# Patient Record
Sex: Male | Born: 1990 | Race: White | Hispanic: No | Marital: Single | Smoking: Never smoker
Health system: Southern US, Community
[De-identification: ages and names within clinical notes are randomized; demographics above are authoritative.]

## PROBLEM LIST (undated history)

## (undated) DIAGNOSIS — F329 Major depressive disorder, single episode, unspecified: Secondary | ICD-10-CM

## (undated) DIAGNOSIS — F32A Depression, unspecified: Secondary | ICD-10-CM

## (undated) HISTORY — PX: WISDOM TOOTH EXTRACTION: SHX21

## (undated) HISTORY — PX: TESTICLE SURGERY: SHX794

## (undated) HISTORY — PX: HIP SURGERY: SHX245

## (undated) HISTORY — PX: HERNIA REPAIR: SHX51

## (undated) HISTORY — DX: Major depressive disorder, single episode, unspecified: F32.9

## (undated) HISTORY — DX: Depression, unspecified: F32.A

---

## 2011-05-08 ENCOUNTER — Emergency Department: Payer: Self-pay | Admitting: Unknown Physician Specialty

## 2012-02-04 ENCOUNTER — Emergency Department (HOSPITAL_COMMUNITY): Payer: Self-pay

## 2012-02-04 ENCOUNTER — Emergency Department (HOSPITAL_COMMUNITY)
Admission: EM | Admit: 2012-02-04 | Discharge: 2012-02-04 | Disposition: A | Discharge status: Home / Self Care | Payer: Self-pay | Attending: Emergency Medicine | Admitting: Emergency Medicine

## 2012-02-04 ENCOUNTER — Encounter (HOSPITAL_COMMUNITY): Payer: Self-pay | Admitting: *Deleted

## 2012-02-04 DIAGNOSIS — R55 Syncope and collapse: Secondary | ICD-10-CM | POA: Insufficient documentation

## 2012-02-04 DIAGNOSIS — M79609 Pain in unspecified limb: Secondary | ICD-10-CM | POA: Insufficient documentation

## 2012-02-04 DIAGNOSIS — L03019 Cellulitis of unspecified finger: Secondary | ICD-10-CM | POA: Insufficient documentation

## 2012-02-04 DIAGNOSIS — L299 Pruritus, unspecified: Secondary | ICD-10-CM | POA: Insufficient documentation

## 2012-02-04 DIAGNOSIS — L02519 Cutaneous abscess of unspecified hand: Secondary | ICD-10-CM | POA: Insufficient documentation

## 2012-02-04 DIAGNOSIS — M7989 Other specified soft tissue disorders: Secondary | ICD-10-CM | POA: Insufficient documentation

## 2012-02-04 DIAGNOSIS — R0602 Shortness of breath: Secondary | ICD-10-CM | POA: Insufficient documentation

## 2012-02-04 DIAGNOSIS — F17200 Nicotine dependence, unspecified, uncomplicated: Secondary | ICD-10-CM | POA: Insufficient documentation

## 2012-02-04 LAB — BASIC METABOLIC PANEL
Calcium: 9.9 mg/dL (ref 8.4–10.5)
Chloride: 101 mEq/L (ref 96–112)
Creatinine, Ser: 0.85 mg/dL (ref 0.50–1.35)
GFR calc Af Amer: >90 mL/min (ref >90)
Sodium: 138 mEq/L (ref 135–145)

## 2012-02-04 LAB — DIFFERENTIAL
Basophils Absolute: 0 10*3/uL (ref 0.0–0.1)
Basophils Relative: 1 % (ref 0–1)
Monocytes Absolute: 1.1 10*3/uL — ABNORMAL HIGH (ref 0.1–1.0)
Neutro Abs: 4.8 10*3/uL (ref 1.7–7.7)

## 2012-02-04 LAB — CBC
HCT: 45.2 % (ref 39.0–52.0)
MCHC: 34.3 g/dL (ref 30.0–36.0)
Platelets: 201 10*3/uL (ref 150–400)
RDW: 11.8 % (ref 11.5–15.5)

## 2012-02-04 MED ORDER — OXYCODONE-ACETAMINOPHEN 5-325 MG PO TABS: 1.0000 | ORAL_TABLET | ORAL | Status: AC | PRN

## 2012-02-04 MED ORDER — PIPERACILLIN-TAZOBACTAM 3.375 G IVPB
3.3750 g | Freq: Once | INTRAVENOUS | Status: AC
  Administered 2012-02-04: 3.375 g via INTRAVENOUS
  Filled 2012-02-04: qty 50

## 2012-02-04 MED ORDER — SULFAMETHOXAZOLE-TRIMETHOPRIM 800-160 MG PO TABS: 1.0000 | ORAL_TABLET | Freq: Two times a day (BID) | ORAL | Status: AC

## 2012-02-04 MED ORDER — VANCOMYCIN HCL IN DEXTROSE 1-5 GM/200ML-% IV SOLN
1000.0000 mg | Freq: Once | INTRAVENOUS | Status: AC
  Administered 2012-02-04: 1000 mg via INTRAVENOUS
  Filled 2012-02-04: qty 200

## 2012-02-04 MED ORDER — TETANUS-DIPHTH-ACELL PERTUSSIS 5-2.5-18.5 LF-MCG/0.5 IM SUSP
0.5000 mL | Freq: Once | INTRAMUSCULAR | Status: AC
  Administered 2012-02-04: 0.5 mL via INTRAMUSCULAR
  Filled 2012-02-04: qty 0.5

## 2012-02-04 NOTE — ED Notes (Signed)
1. Wound to right hand middle finger for 1 week, finger swollen, and draining clear/pus, red streaking up arm. 2. Also wants his breathing checked, has "been having trouble since allergy season started", worse in the am when getting up

## 2012-02-04 NOTE — ED Notes (Signed)
Pt had near syncopal episode after iv start. Pt pale and diaphoretic/n. Pt placed in trendelenburg, damp cloth given.nad noted.

## 2012-02-04 NOTE — ED Provider Notes (Signed)
History     CSN: [id number]  Arrival date & time 02/04/12  1008   First MD Initiated Contact with Patient 02/04/12 1305      Chief Complaint  Patient presents with  . Wound Check  . Shortness of Breath     Patient is a 21 y.o. male presenting with shortness of breath and abscess. The history is provided by the patient and a relative.  Shortness of Breath  Associated symptoms include shortness of breath. Pertinent negatives include no fever.  Abscess  This is a new problem. The current episode started more than one week ago. The onset was gradual. The problem occurs continuously. The problem has been gradually worsening. Affected Location: right middle finger. The problem is moderate. The abscess is characterized by itchiness, redness, painfulness and draining. The abscess first occurred at work. Pertinent negatives include no fever and no vomiting. He has received no recent medical care.  Pt presents with wound to right middle finger.  He reports it started as poison oak, and then became more swollen, draining and now streaking up his arm No fever but does report chills No cp No vomiting No abd pain No trauma reported He is not diabetic  PMH - none  Past Surgical History  Procedure Date  . Hip surgery   . Hernia repair   . Testicle surgery   . Wisdom tooth extraction     No family history on file.  History  Substance Use Topics  . Smoking status: Current Some Day Smoker  . Smokeless tobacco: Not on file  . Alcohol Use: Yes     occ      Review of Systems  Constitutional: Negative for fever.  Respiratory: Positive for shortness of breath.   Gastrointestinal: Negative for vomiting.  All other systems reviewed and are negative.    Allergies  Review of patient's allergies indicates not on file.  Home Medications  No current outpatient prescriptions on file.  BP 122/62  Pulse 74  Temp 97.7 F (36.5 C)  Resp 18  Ht 6' (1.829 m)  Wt 145 lb 1 oz (65.8 kg)   BMI 19.67 kg/m2  SpO2 100%  Physical Exam CONSTITUTIONAL: Well developed/well nourished HEAD AND FACE: Normocephalic/atraumatic EYES: EOMI/PERRL ENMT: Mucous membranes moist NECK: supple no meningeal signs SPINE:entire spine nontender CV: S1/S2 noted, no murmurs/rubs/gallops noted LUNGS: Lungs are clear to auscultation bilaterally, no apparent distress ABDOMEN: soft, nontender, no rebound or guarding GU:no cva tenderness NEURO: Pt is awake/alert, moves all extremitiesx4 EXTREMITIES: pulses normal - right middle finger - he has dry wound on aspect of distal dorsal surface just proximal to PIP without fluctuance or drainage/bleeding.  No crepitance.  He has edema/erythema to the finger localized around the wound, but no tenderness on flexor/extensor surface but he does have tenderness on medial/lateral aspect of finger.  He has erythematous streaking that extends to hand and into forearm He is able to fully range the finger SKIN: warm, color normal PSYCH: no abnormalities of mood noted  ED Course  Procedures    Labs Reviewed  CBC  DIFFERENTIAL  BASIC METABOLIC PANEL   Pt had brief vasovagal episode while getting IV placed, but now back to baseline and this occurred while in bed.  He is awake/alert at this time, no distress Also, he report SOB since "spring" with pollen, no distress noted, no CP.  Lungs clear 3:42 PM Discussed case with dr Romeo Apple He requests I call [facility] at [facility] 4:16 PM D/w  dr Amanda Pea He agrees with vancomycin and zosyn After discussion of his exam, we agreed to d/c home, start bactrim and pain meds and he is to return to [place] [facility] tomorrow for recheck and to call Dr Amanda Pea to come and see patient. Pt agreeable and he is to be NPO in the morning   MDM  Nursing notes reviewed and considered in documentation xrays reviewed and considered All labs/vitals reviewed and considered        Date: 02/04/2012  Rate: 62  Rhythm: normal sinus  rhythm  QRS Axis: normal  Intervals: normal  ST/T Wave abnormalities: normal  Conduction Disutrbances:none    Joya Gaskins, MD 02/04/12 [phone]

## 2012-02-05 ENCOUNTER — Emergency Department (HOSPITAL_COMMUNITY)
Admission: EM | Admit: 2012-02-05 | Discharge: 2012-02-05 | Disposition: A | Discharge status: Home / Self Care | Payer: Self-pay | Attending: Emergency Medicine | Admitting: Emergency Medicine

## 2012-02-05 ENCOUNTER — Encounter (HOSPITAL_COMMUNITY): Payer: Self-pay | Admitting: Emergency Medicine

## 2012-02-05 DIAGNOSIS — F17200 Nicotine dependence, unspecified, uncomplicated: Secondary | ICD-10-CM | POA: Insufficient documentation

## 2012-02-05 DIAGNOSIS — L03019 Cellulitis of unspecified finger: Secondary | ICD-10-CM | POA: Insufficient documentation

## 2012-02-05 DIAGNOSIS — J45909 Unspecified asthma, uncomplicated: Secondary | ICD-10-CM | POA: Insufficient documentation

## 2012-02-05 DIAGNOSIS — L02519 Cutaneous abscess of unspecified hand: Secondary | ICD-10-CM | POA: Insufficient documentation

## 2012-02-05 DIAGNOSIS — L03011 Cellulitis of right finger: Secondary | ICD-10-CM

## 2012-02-05 MED ORDER — ONDANSETRON HCL 4 MG/2ML IJ SOLN
4.0000 mg | Freq: Once | INTRAMUSCULAR | Status: DC
  Filled 2012-02-05: qty 2

## 2012-02-05 MED ORDER — LORAZEPAM 1 MG PO TABS
0.5000 mg | ORAL_TABLET | Freq: Once | ORAL | Status: AC
  Administered 2012-02-05: 0.5 mg via ORAL
  Filled 2012-02-05: qty 1

## 2012-02-05 MED ORDER — VANCOMYCIN HCL IN DEXTROSE 1-5 GM/200ML-% IV SOLN
1000.0000 mg | Freq: Once | INTRAVENOUS | Status: AC
  Administered 2012-02-05: 1000 mg via INTRAVENOUS
  Filled 2012-02-05: qty 200

## 2012-02-05 MED ORDER — PIPERACILLIN-TAZOBACTAM 3.375 G IVPB
3.3750 g | Freq: Once | INTRAVENOUS | Status: AC
  Administered 2012-02-05: 3.375 g via INTRAVENOUS
  Filled 2012-02-05: qty 50

## 2012-02-05 MED ORDER — SODIUM CHLORIDE 0.9 % IV BOLUS (SEPSIS)
500.0000 mL | Freq: Once | INTRAVENOUS | Status: AC
  Administered 2012-02-05: 1000 mL via INTRAVENOUS

## 2012-02-05 NOTE — ED Notes (Addendum)
Pt here for swelling to right middle finger and "spots" on genitals x 1 week. Pt seen at [facility] for same yesterday. Pt wasn't able to fill prescriptions. Pt also c/o cough and congestion.

## 2012-02-05 NOTE — ED Provider Notes (Signed)
History     CSN: [id number]  Arrival date & time 02/05/12  0865   First MD Initiated Contact with Patient 02/05/12 [phone]      Chief Complaint  Patient presents with  . Wound Check    (Consider location/radiation/quality/duration/timing/severity/associated sxs/prior treatment) HPI  Patient presents to emergency department for recheck of right middle finger infection is seen at [facility] emergency department last night by Dr. Bebe Shaggy who spoke with Dr. Amanda Pea and patient was instructed to present to the [facility] emergency department today at 9 AM, n.p.o., for further evaluation and management by Dr. Amanda Pea. Patient states that about a week ago he had a rash on his right middle finger that he thought was consistent with poison oak or poison ivy stating that he was pulling a bunch of vines at his job. Patient states the rash was initially itchy and mildly irritated. However, over the course of the week his right middle finger became more red, swollen, and painful. Patient is also complaining of a few itchy bumps on his penis. Patient states that the spots are similar to the initial spots seen on his finger. However patient denies any redness, pain, swelling, or drainage from the lesions on his penis. Patient states that they mildly itchy. He denies any fevers, chills, numbness or tingling of right hand, drainage from wound. He denies any nausea or vomiting. He denies any penile drainage or testicular pain or swelling.   Patient received 2 doses of IV vancomycin and Zosyn in the ER last night. He was prescribed by mouth Bactrim for home. Patient states he did not take a dose of Bactrim yet because he was n.p.o. for this morning.  Past Medical History  Diagnosis Date  . Asthma     Past Surgical History  Procedure Date  . Hip surgery   . Hernia repair   . Testicle surgery   . Wisdom tooth extraction     History reviewed. No pertinent family history.  History  Substance Use Topics  .  Smoking status: Current Some Day Smoker  . Smokeless tobacco: Not on file  . Alcohol Use: Yes     occ      Review of Systems  All other systems reviewed and are negative.    Allergies  Review of patient's allergies indicates no known allergies.  Home Medications   Current Outpatient Rx  Name Route Sig Dispense Refill  . DIPHENHYDRAMINE HCL 25 MG PO TABS Oral Take 25 mg by mouth every 6 (six) hours as needed. For allergies    . MONTELUKAST SODIUM 10 MG PO TABS Oral Take 10 mg by mouth daily as needed. For allergies    . OXYCODONE-ACETAMINOPHEN 5-325 MG PO TABS Oral Take 1 tablet by mouth every 4 (four) hours as needed for pain. 15 tablet 0  . SULFAMETHOXAZOLE-TRIMETHOPRIM 800-160 MG PO TABS Oral Take 1 tablet by mouth every 12 (twelve) hours. 14 tablet 0    BP 113/59  Pulse 84  Temp 98 F (36.7 C)  Resp 18  SpO2 100%  Physical Exam  Nursing note and vitals reviewed. Constitutional: He is oriented to person, place, and time. He appears well-developed and well-nourished. No distress.  HENT:  Head: Normocephalic and atraumatic.  Eyes: Conjunctivae are normal.  Neck: Normal range of motion. Neck supple.  Cardiovascular: Normal rate, regular rhythm, normal heart sounds and intact distal pulses.  Exam reveals no gallop and no friction rub.   No murmur heard. Pulmonary/Chest: Effort normal and breath  sounds normal. No respiratory distress. He has no wheezes. He has no rales. He exhibits no tenderness.  Abdominal: Soft. Bowel sounds are normal. He exhibits no distension and no mass. There is no tenderness. There is no rebound and no guarding.  Musculoskeletal: Normal range of motion. He exhibits edema and tenderness.       Entire right middle finger is swollen. Full range of motion with little to no pain. Good cap refill. See skin exam. Faint radiating erythema on the dorsal aspect of finger into hand.  Neurological: He is alert and oriented to person, place, and time.  Skin:  Skin is warm and dry. No rash noted. He is not diaphoretic. There is erythema.       4 x 4 millimeter ulceration of lateral edge of right middle finger with surrounding erythema but no drainage or fluctuance. Entire right middle finger is swollen.  Psychiatric: He has a normal mood and affect.    ED Course  Procedures (including critical care time)  9:34 AM I spoke with Dr. Amanda Pea who will evaluation patient in ER.   Dr. Amanda Pea evaluated patient in the ER. He is requesting one dose of IV vancomycin and Zosyn in the ER and then discharged home to have him followup in his office on Monday, 2 days from now, for recheck. Patient is to continue the PO Bactrim as directed last night. Patient and his family are agreeable to plan.  Labs Reviewed - No data to display Dg Finger Middle Right  02/04/2012  *RADIOLOGY REPORT*  Clinical Data: Redness and swelling of the finger.  RIGHT MIDDLE FINGER 2+V  Comparison: None.  Findings: Three-view exam shows no fracture or subluxation.  No worrisome lytic or sclerotic osseous abnormality.  No evidence for radiopaque soft tissue foreign body.  IMPRESSION: No acute bony findings.  Original Report Authenticated By: ERIC A. MANSELL, M.D.     1. Cellulitis of finger, right       MDM  Further assessment and treatment plan made with myself, Dr. Rubin Payor, and Dr. Amanda Pea. Patient is agreeable to following with Dr. Amanda Pea on Monday. We spoke at length about changing or worsening symptoms that should prompt immediate return to emergency department. Patient voices his understanding and is agreeable to plan.        Jenness Corner, [place] 02/05/12 1201

## 2012-02-05 NOTE — Discharge Instructions (Signed)
Take your antibiotics in its complete course. Keep wound on finger clean with mild soap and water and dressed with clean bandage. Call Dr. Carlos Levering office first thing Monday morning to schedule close followup appointment on Monday. Explain that you were seen in the ER and that he wanted your appointment on Monday. Return to emergency department for any emergent changing or worsening symptoms. Ice and elevate for additional pain relief, alternating between Tylenol and ibuprofen for pain.

## 2012-02-05 NOTE — Consult Note (Signed)
Reason for Consult:Infection right middle finger Referring Physician: [name] is an 21 y.o. male.  HPI: Patient is a pleasant male with a right middle finger wound off of the ulnar aspect of the middle phalanx region. He was given vancomycin and Zosyn yesterday and started on by mouth antibiotics. He has not obtained the antibiotics. He is here today with his family. He states the hand feels better. There is concern in regards to ascending cellulitis yesterday and thus he was referred to [facility]. I was asked to see him and comment on his infection and quarterback his care  I discussed with his family and the ER staff his upper extremity predicament. The patient has improvement in his hand subjectively and object to bleed appears. The patient is able to move the finger. He states this may have started after yardwork and exposure to poison oak.  Present time he is alert and oriented he notes no locking popping catching numbness or tingling.  Past Medical History  Diagnosis Date  . Asthma     Past Surgical History  Procedure Date  . Hip surgery   . Hernia repair   . Testicle surgery   . Wisdom tooth extraction     History reviewed. No pertinent family history.  Social History:  reports that he has been smoking.  He does not have any smokeless tobacco history on file. He reports that he drinks alcohol. He reports that he does not use illicit drugs.  Allergies: No Known Allergies  Medications: I have reviewed the patient's current medications.  Results for orders placed during the hospital encounter of 02/04/12 (from the past 48 hour(s))  CBC     Status: Normal   Collection Time   02/04/12  1:29 PM      Component Value Range Comment   WBC 8.5  4.0 - 10.5 (K/uL)    RBC 5.21  4.22 - 5.81 (MIL/uL)    Hemoglobin 15.5  13.0 - 17.0 (g/dL)    HCT 09.8  11.9 - 14.7 (%)    MCV 86.8  78.0 - 100.0 (fL)    MCH 29.8  26.0 - 34.0 (pg)    MCHC 34.3  30.0 - 36.0 (g/dL)    RDW  82.9  56.2 - 13.0 (%)    Platelets 201  150 - 400 (K/uL)   DIFFERENTIAL     Status: Abnormal   Collection Time   02/04/12  1:29 PM      Component Value Range Comment   Neutrophils Relative 57  43 - 77 (%)    Neutro Abs 4.8  1.7 - 7.7 (K/uL)    Lymphocytes Relative 27  12 - 46 (%)    Lymphs Abs 2.3  0.7 - 4.0 (K/uL)    Monocytes Relative 13 (*) 3 - 12 (%)    Monocytes Absolute 1.1 (*) 0.1 - 1.0 (K/uL)    Eosinophils Relative 3  0 - 5 (%)    Eosinophils Absolute 0.3  0.0 - 0.7 (K/uL)    Basophils Relative 1  0 - 1 (%)    Basophils Absolute 0.0  0.0 - 0.1 (K/uL)   BASIC METABOLIC PANEL     Status: Normal   Collection Time   02/04/12  1:29 PM      Component Value Range Comment   Sodium 138  135 - 145 (mEq/L)    Potassium 3.5  3.5 - 5.1 (mEq/L)    Chloride 101  96 - 112 (mEq/L)  CO2 26  19 - 32 (mEq/L)    Glucose, Bld 84  70 - 99 (mg/dL)    BUN 12  6 - 23 (mg/dL)    Creatinine, Ser 4.69  0.50 - 1.35 (mg/dL)    Calcium 9.9  8.4 - 10.5 (mg/dL)    GFR calc non Af Amer >90  >90 (mL/min)    GFR calc Af Amer >90  >90 (mL/min)     Dg Finger Middle Right  02/04/2012  *RADIOLOGY REPORT*  Clinical Data: Redness and swelling of the finger.  RIGHT MIDDLE FINGER 2+V  Comparison: None.  Findings: Three-view exam shows no fracture or subluxation.  No worrisome lytic or sclerotic osseous abnormality.  No evidence for radiopaque soft tissue foreign body.  IMPRESSION: No acute bony findings.  Original Report Authenticated By: ERIC A. MANSELL, M.D.    Review of Systems  HENT: Negative.   Eyes: Negative.   Respiratory: Positive for cough.   Cardiovascular: Negative.   Gastrointestinal: Negative.   Neurological: Negative.   Endo/Heme/Allergies: Negative.   Psychiatric/Behavioral: Negative.    Blood pressure 113/59, pulse 84, temperature 98 F (36.7 C), resp. rate 18, SpO2 100.00%. Physical Exam Patient is a pleasant male he has improvement in his upper extremity regarding cellulitis. He has  full ability to extend and flex the finger there is no instability symptoms. He has normal refill to the tip of the finger. I have performed a brief irrigation and debridement to the finger. Following this I discussed with the patient the necessity of soaking this 3 times a day and proper management. I dressed the wound myself. He has no [place] signs. He has no evidence of instability. There is no obvious bony abnormality. Marland Kitchen.The patient is alert and oriented in no acute distress the patient complains of pain in the affected upper extremity. The patient is noted to have a normal HEENT exam. Lung fields show equal chest expansion and no shortness of breath.  He does have some complaints of cough. abdomen exam is nontender without distention. Lower extremity examination does not show any fracture dislocation or blood clot symptoms. Pelvis is stable neck and back are stable and nontender Assessment/Plan: Cellulitis hand after a middle finger skin disruption. I would recommend additional vancomycin and Zosyn today in the emergency room and following this followup care in my office. I discussed with them all issues. I have personally written for Bactrim DS to be taken twice a day for 14 days. I discussed with the family warm soaks topical Neosporin and provided dressing supplies for home use.  I look forward to seeing him back in the office in 48-72 hours and asked him to notify me should any problems occur.  All issues have been discussed and all questions encouraged and answered. Certainly he looks improved however we'll keep an eye on the residual cellulitis which is very minimal today. It was a pleasure to see them  Karen Chafe 02/05/2012, 12:22 PM

## 2012-02-05 NOTE — ED Notes (Signed)
Dr Gramig at bedside. 

## 2012-02-06 NOTE — ED Provider Notes (Signed)
Medical screening examination/treatment/procedure(s) were performed by non-physician practitioner and as supervising physician I was immediately available for consultation/collaboration.  Tennille Montelongo R. Zoe Nordin, MD 02/06/12 0658 

## 2012-04-17 ENCOUNTER — Emergency Department (HOSPITAL_COMMUNITY)
Admission: EM | Admit: 2012-04-17 | Discharge: 2012-04-17 | Disposition: A | Discharge status: Home / Self Care | Payer: Self-pay | Attending: Emergency Medicine | Admitting: Emergency Medicine

## 2012-04-17 ENCOUNTER — Encounter (HOSPITAL_COMMUNITY): Payer: Self-pay | Admitting: *Deleted

## 2012-04-17 DIAGNOSIS — J029 Acute pharyngitis, unspecified: Secondary | ICD-10-CM | POA: Insufficient documentation

## 2012-04-17 DIAGNOSIS — F17200 Nicotine dependence, unspecified, uncomplicated: Secondary | ICD-10-CM | POA: Insufficient documentation

## 2012-04-17 MED ORDER — PENICILLIN G BENZATHINE 1200000 UNIT/2ML IM SUSP
1.2000 10*6.[IU] | Freq: Once | INTRAMUSCULAR | Status: AC
  Administered 2012-04-17: 1.2 10*6.[IU] via INTRAMUSCULAR
  Filled 2012-04-17: qty 2

## 2012-04-17 MED ORDER — IBUPROFEN 800 MG PO TABS
800.0000 mg | ORAL_TABLET | Freq: Once | ORAL | Status: AC
  Administered 2012-04-17: 800 mg via ORAL
  Filled 2012-04-17: qty 1

## 2012-04-17 NOTE — ED Provider Notes (Signed)
Medical screening examination/treatment/procedure(s) were performed by non-physician practitioner and as supervising physician I was immediately available for consultation/collaboration.  Shelda Jakes, MD 04/17/12 [phone]

## 2012-04-17 NOTE — ED Notes (Addendum)
C/o sore throat that started yesterday and rash to right middle finger area for the past few days, pt states that he has been tx for ?infection on the same finger before.

## 2012-04-17 NOTE — ED Notes (Signed)
Sore throat since yesterday.

## 2012-04-17 NOTE — ED Provider Notes (Signed)
History     CSN: [id number]  Arrival date & time 04/17/12  1516   First MD Initiated Contact with Patient 04/17/12 1555      Chief Complaint  Patient presents with  . Sore Throat    (Consider location/radiation/quality/duration/timing/severity/associated sxs/prior treatment) HPI Comments: Had headache yest also.  Denies abdominal pain   Patient is a 21 y.o. male presenting with pharyngitis. The history is provided by the patient. No language interpreter was used.  Sore Throat This is a new problem. The problem occurs constantly. The problem has been unchanged. Associated symptoms include chills, a fever, headaches, myalgias and a sore throat. Nothing aggravates the symptoms. He has tried nothing for the symptoms. The treatment provided no relief.    Past Medical History  Diagnosis Date  . Asthma     Past Surgical History  Procedure Date  . Hip surgery   . Hernia repair   . Testicle surgery   . Wisdom tooth extraction     History reviewed. No pertinent family history.  History  Substance Use Topics  . Smoking status: Current Some Day Smoker  . Smokeless tobacco: Not on file  . Alcohol Use: Yes     occ      Review of Systems  Constitutional: Positive for fever and chills.  HENT: Positive for sore throat.   Musculoskeletal: Positive for myalgias.  Neurological: Positive for headaches.  All other systems reviewed and are negative.    Allergies  Review of patient's allergies indicates no known allergies.  Home Medications   Current Outpatient Rx  Name Route Sig Dispense Refill  . ALBUTEROL SULFATE HFA 108 (90 BASE) MCG/ACT IN AERS Inhalation Inhale 2 puffs into the lungs every 6 (six) hours as needed. For shortness of breath.    Marland Kitchen DIPHENHYDRAMINE HCL 25 MG PO TABS Oral Take 25 mg by mouth every 6 (six) hours as needed. For allergies    . MONTELUKAST SODIUM 10 MG PO TABS Oral Take 10 mg by mouth daily as needed. For allergies      BP 120/62  Pulse 76   Temp 98.5 F (36.9 C) (Oral)  Resp 20  Ht 6' (1.829 m)  Wt 145 lb (65.772 kg)  BMI 19.67 kg/m2  SpO2 100%  Physical Exam  Nursing note and vitals reviewed. Constitutional: He is oriented to person, place, and time. He appears well-developed and well-nourished.  HENT:  Head: Normocephalic and atraumatic. No trismus in the jaw.  Mouth/Throat: Uvula is midline and mucous membranes are normal. No uvula swelling. Oropharyngeal exudate and posterior oropharyngeal erythema present. No posterior oropharyngeal edema.  Eyes: EOM are normal.  Neck: Normal range of motion.  Cardiovascular: Normal rate, regular rhythm, normal heart sounds and intact distal pulses.   Pulmonary/Chest: Effort normal and breath sounds normal. No respiratory distress.  Abdominal: Soft. He exhibits no distension. There is no splenomegaly. There is no tenderness. There is no rigidity and no guarding.  Musculoskeletal: Normal range of motion.  Lymphadenopathy:    He has cervical adenopathy.       Right cervical: Superficial cervical and deep cervical adenopathy present.       Left cervical: Superficial cervical and deep cervical adenopathy present.  Neurological: He is alert and oriented to person, place, and time.  Skin: Skin is warm and dry.  Psychiatric: He has a normal mood and affect. Judgment normal.    ED Course  Procedures (including critical care time)  Labs Reviewed - No data to display No results  found.   1. Pharyngitis       MDM  Doubt mono at present.  Pt prefers the IM injection of bicillin LA.  Ibuprofen, salt water gargles and chloraseptic.  F/u with PCP.        Evalina Field, [place] 04/17/12 1625

## 2012-07-08 ENCOUNTER — Emergency Department (HOSPITAL_COMMUNITY)
Admission: EM | Admit: 2012-07-08 | Discharge: 2012-07-08 | Disposition: A | Discharge status: Home / Self Care | Payer: Self-pay | Attending: Emergency Medicine | Admitting: Emergency Medicine

## 2012-07-08 ENCOUNTER — Encounter (HOSPITAL_COMMUNITY): Payer: Self-pay | Admitting: *Deleted

## 2012-07-08 ENCOUNTER — Emergency Department (HOSPITAL_COMMUNITY): Payer: Self-pay

## 2012-07-08 DIAGNOSIS — W268XXA Contact with other sharp object(s), not elsewhere classified, initial encounter: Secondary | ICD-10-CM | POA: Insufficient documentation

## 2012-07-08 DIAGNOSIS — S61209A Unspecified open wound of unspecified finger without damage to nail, initial encounter: Secondary | ICD-10-CM | POA: Insufficient documentation

## 2012-07-08 DIAGNOSIS — S61217A Laceration without foreign body of left little finger without damage to nail, initial encounter: Secondary | ICD-10-CM

## 2012-07-08 DIAGNOSIS — J45909 Unspecified asthma, uncomplicated: Secondary | ICD-10-CM | POA: Insufficient documentation

## 2012-07-08 DIAGNOSIS — F17200 Nicotine dependence, unspecified, uncomplicated: Secondary | ICD-10-CM | POA: Insufficient documentation

## 2012-07-08 MED ORDER — CEPHALEXIN 500 MG PO CAPS
500.0000 mg | ORAL_CAPSULE | Freq: Once | ORAL | Status: AC
  Administered 2012-07-08: 500 mg via ORAL
  Filled 2012-07-08: qty 1

## 2012-07-08 MED ORDER — CEPHALEXIN 500 MG PO CAPS: 500.0000 mg | ORAL_CAPSULE | Freq: Four times a day (QID) | ORAL | Status: DC

## 2012-07-08 NOTE — ED Notes (Signed)
Lac to left hand x 1 week ago. Tetanus UTD. Pt applied super glue to cut and noticed oozing this morning.

## 2012-07-08 NOTE — ED Provider Notes (Signed)
History     CSN: [id number]  Arrival date & time 07/08/12  1236   First MD Initiated Contact with Patient 07/08/12 1340      Chief Complaint  Patient presents with  . Extremity Laceration    (Consider location/radiation/quality/duration/timing/severity/associated sxs/prior treatment) HPI Comments: Patient complains of a nonhealing laceration to the left hand for one week. He states that his hand was caught on a metal edge. He tried to close the wound with bandages, but states the wound" opening up". States the wound has a persistent watery drainage that appears to be mixed with blood. Patient states he applied an over-the-counter" Super Glue" over the wound to stop the drainage. He also states that he has been unable to fully extend the distal end of the left finger since the incident occurred. He denies pain, swelling, fever, red streaks, or numbness of the left fingers.  Patient is a 21 y.o. male presenting with skin laceration. The history is provided by the patient and a parent.  Laceration  The incident occurred more than 2 days ago. Pain location: Left proximal fifth finger. The laceration is 3 cm in size. The laceration mechanism was a a metal edge. The patient is experiencing no pain. It is unknown if a foreign body is present. His tetanus status is UTD.    Past Medical History  Diagnosis Date  . Asthma     Past Surgical History  Procedure Date  . Hip surgery   . Hernia repair   . Testicle surgery   . Wisdom tooth extraction     No family history on file.  History  Substance Use Topics  . Smoking status: Current Some Day Smoker  . Smokeless tobacco: Not on file  . Alcohol Use: Yes     occ      Review of Systems  Constitutional: Negative for fever and chills.  Musculoskeletal: Negative for back pain, joint swelling and arthralgias.  Skin: Positive for wound.       Laceration   Neurological: Negative for dizziness, weakness, numbness and headaches.    Hematological: Does not bruise/bleed easily.  All other systems reviewed and are negative.    Allergies  Review of patient's allergies indicates no known allergies.  Home Medications   Current Outpatient Rx  Name Route Sig Dispense Refill  . ACETAMINOPHEN 500 MG PO TABS Oral Take 1,000 mg by mouth every 6 (six) hours as needed. Pain    . ALBUTEROL SULFATE HFA 108 (90 BASE) MCG/ACT IN AERS Inhalation Inhale 2 puffs into the lungs every 6 (six) hours as needed. For shortness of breath.      BP 135/84  Temp 98.2 F (36.8 C) (Oral)  Resp 16  SpO2 100%  Physical Exam  Nursing note and vitals reviewed. Constitutional: He is oriented to person, place, and time. He appears well-developed and well-nourished. No distress.  HENT:  Head: Normocephalic and atraumatic.  Cardiovascular: Normal rate, regular rhythm and normal heart sounds.   Pulmonary/Chest: Effort normal and breath sounds normal.  Musculoskeletal: He exhibits no edema and no tenderness.       Left hand: He exhibits decreased range of motion and laceration. He exhibits no tenderness, no bony tenderness, normal two-point discrimination, normal capillary refill, no deformity and no swelling. normal sensation noted. Normal strength noted.       Hands:      3 cm laceration to the dorsal surface of the proximal left fifth finger.  Wound edges are not approximated but appear  occluded with clear adhesive. Slight serosanguineous drainage at one end of the wound. Mild surrounding erythema, no edema or lymphangitis. Decreased extension of the left fifth finger at the level of the PIP joint. Radial pulse is brisk, distal sensation intact, cap refill is less than 2 seconds.  Neurological: He is alert and oriented to person, place, and time. He exhibits normal muscle tone. Coordination normal.  Skin: Skin is warm. Laceration noted.       See MS exam    ED Course  Procedures (including critical care time)  Labs Reviewed - No data to  display Dg Hand Complete Left  07/08/2012  *RADIOLOGY REPORT*  Clinical Data: Laceration at fifth MCP joint with pain.  LEFT HAND - COMPLETE 3+ VIEW  Comparison: None.  Findings: Probable soft tissue injury about the ulnar side of the fifth metacarpal phalangeal joint.  No radio-opaque foreign body. No acute fracture or dislocation.  IMPRESSION: Soft tissue injury adjacent the fifth MCP joint.  No acute osseous finding or radiopaque foreign object.   Original Report Authenticated By: Consuello Bossier, M.D.      Left finger was splinted and wound dressing applied, remains neurovascularly intact   MDM    Week old laceration to the left hand over the metacarpal head.  Wound is draining serous fluid, cvered with super glue by the patient. No edema , pus or red streaks at this time.  I have advised him of importance of close f/u and risk of infection.  He is aware and understands that it is a high possibilty of a tendon injury to the fifth finger, but states he does not want to see orthopedics for repair.  He also understands that the long-term effects of the injury could also result in decreased range of motion in the finger.  He agrees to return here for recheck if symptoms of infection are developing.  Pt agrees to care plan and verbalized understanding   The patient appears reasonably screened and/or stabilized for discharge and I doubt any other medical condition or other [facility] requiring further screening, evaluation, or treatment in the ED at this time prior to discharge.   Prescribed: Keflex     Asahd Can L. [place], [place] 07/09/12 1732

## 2012-07-17 NOTE — ED Provider Notes (Signed)
Medical screening examination/treatment/procedure(s) were performed by non-physician practitioner and as supervising physician I was immediately available for consultation/collaboration.  Arnol Mcgibbon T Trai Ells, MD 07/17/12 2131 

## 2013-08-08 ENCOUNTER — Ambulatory Visit (INDEPENDENT_AMBULATORY_CARE_PROVIDER_SITE_OTHER): Payer: PRIVATE HEALTH INSURANCE | Admitting: Family Medicine

## 2013-08-08 ENCOUNTER — Encounter: Payer: Self-pay | Admitting: Family Medicine

## 2013-08-08 ENCOUNTER — Encounter (INDEPENDENT_AMBULATORY_CARE_PROVIDER_SITE_OTHER): Payer: Self-pay

## 2013-08-08 VITALS — BP 124/76 | HR 71 | Temp 98.1°F | Ht 71.0 in | Wt 153.8 lb

## 2013-08-08 DIAGNOSIS — Z0000 Encounter for general adult medical examination without abnormal findings: Secondary | ICD-10-CM

## 2013-08-08 LAB — POCT CBC
Granulocyte percent: 55.6 %G (ref 37–80)
HCT, POC: 51.3 % (ref 43.5–53.7)
Hemoglobin: 16.6 g/dL (ref 14.1–18.1)
Lymph, poc: 2.7 (ref 0.6–3.4)
MCH, POC: 28.6 pg (ref 27–31.2)
MCHC: 32.3 g/dL (ref 31.8–35.4)
MCV: 88.6 fL (ref 80–97)
MPV: 7.4 fL (ref 0–99.8)
POC Granulocyte: 3.9 (ref 2–6.9)
POC LYMPH PERCENT: 38.6 %L (ref 10–50)
Platelet Count, POC: 225 10*3/uL (ref 142–424)
RBC: 5.8 M/uL (ref 4.69–6.13)
RDW, POC: 12.2 %
WBC: 7.1 10*3/uL (ref 4.6–10.2)

## 2013-08-08 MED ORDER — LORAZEPAM 0.5 MG PO TABS: 0.5000 mg | ORAL_TABLET | Freq: Two times a day (BID) | ORAL | Status: DC | PRN

## 2013-08-08 MED ORDER — PAROXETINE HCL 10 MG PO TABS: 10.0000 mg | ORAL_TABLET | Freq: Every day | ORAL | Status: DC

## 2013-08-08 NOTE — Patient Instructions (Signed)

## 2013-08-08 NOTE — Progress Notes (Signed)
  Subjective:    Patient ID: Juan Woods, male    DOB: Jun 01, 1991, 22 y.o.   MRN: [id number]  HPI This 22 y.o. male presents for evaluation of wellness exam. He has hx of anxiety and is taking paxil.  He states the paxil is Working well but he has had some breakthrough anxiety on occasion.   Review of Systems C/o anxiety No chest pain, SOB, HA, dizziness, vision change, N/V, diarrhea, constipation, dysuria, urinary urgency or frequency, myalgias, arthralgias or rash.     Objective:   Physical Exam Vital signs noted  Well developed well nourished male.  HEENT - Head atraumatic Normocephalic                Eyes - PERRLA, Conjuctiva - clear Sclera- Clear EOMI                Ears - EAC's Wnl TM's Wnl Gross Hearing WNL                Nose - Nares patent                 Throat - oropharanx wnl Respiratory - Lungs CTA bilateral Cardiac - RRR S1 and S2 without murmur GI - Abdomen soft Nontender and bowel sounds active x 4 Extremities - No edema. Neuro - Grossly intact.       Assessment & Plan:  Routine general medical examination at a health care facility - Plan: POCT CBC, CMP14+EGFR, Thyroid Panel With TSH, Lipid panel  Anxiety - Continue Paxil at curent dose.  Add Lorazepam 0.5mg  po bid prn severe anxiety #30 no refill  Deatra Canter FNP

## 2013-08-09 LAB — CMP14+EGFR
ALT: 14 IU/L (ref 0–44)
AST: 18 IU/L (ref 0–40)
Albumin/Globulin Ratio: 2.7 — ABNORMAL HIGH (ref 1.1–2.5)
Albumin: 4.9 g/dL (ref 3.5–5.5)
Alkaline Phosphatase: 55 IU/L (ref 39–117)
BUN/Creatinine Ratio: 15 (ref 8–19)
BUN: 16 mg/dL (ref 6–20)
CO2: 29 mmol/L (ref 18–29)
Calcium: 10.1 mg/dL (ref 8.7–10.2)
Chloride: 98 mmol/L (ref 97–108)
Creatinine, Ser: 1.07 mg/dL (ref 0.76–1.27)
GFR calc Af Amer: 115 mL/min/{1.73_m2} (ref >59)
GFR calc non Af Amer: 99 mL/min/{1.73_m2} (ref >59)
Globulin, Total: 1.8 g/dL (ref 1.5–4.5)
Glucose: 84 mg/dL (ref 65–99)
Potassium: 5 mmol/L (ref 3.5–5.2)
Sodium: 142 mmol/L (ref 134–144)
Total Bilirubin: 0.5 mg/dL (ref 0.0–1.2)
Total Protein: 6.7 g/dL (ref 6.0–8.5)

## 2013-08-09 LAB — LIPID PANEL
Chol/HDL Ratio: 3 ratio units (ref 0.0–5.0)
Cholesterol, Total: 158 mg/dL (ref 100–189)
HDL: 53 mg/dL (ref >39)
LDL Calculated: 92 mg/dL (ref 0–119)
Triglycerides: 66 mg/dL (ref 0–114)
VLDL Cholesterol Cal: 13 mg/dL (ref 5–40)

## 2013-08-09 LAB — THYROID PANEL WITH TSH
Free Thyroxine Index: 1.4 (ref 1.2–4.9)
T3 Uptake Ratio: 30 % (ref 24–39)
T4, Total: 4.6 ug/dL (ref 4.5–12.0)
TSH: 1.48 u[IU]/mL (ref 0.450–4.500)

## 2014-09-08 ENCOUNTER — Other Ambulatory Visit: Payer: Self-pay | Admitting: Family Medicine

## 2014-11-12 ENCOUNTER — Other Ambulatory Visit: Payer: Self-pay | Admitting: Family Medicine

## 2014-11-13 ENCOUNTER — Other Ambulatory Visit: Payer: Self-pay | Admitting: Family Medicine

## 2014-11-14 ENCOUNTER — Other Ambulatory Visit: Payer: Self-pay | Admitting: Family Medicine

## 2017-02-19 ENCOUNTER — Emergency Department (HOSPITAL_COMMUNITY)
Admission: EM | Admit: 2017-02-19 | Discharge: 2017-02-21 | Disposition: A | Discharge status: Other Acute Inpatient Hospital | Payer: Managed Care, Other (non HMO) | Attending: Emergency Medicine | Admitting: Emergency Medicine

## 2017-02-19 DIAGNOSIS — F10920 Alcohol use, unspecified with intoxication, uncomplicated: Secondary | ICD-10-CM

## 2017-02-19 DIAGNOSIS — F332 Major depressive disorder, recurrent severe without psychotic features: Secondary | ICD-10-CM | POA: Diagnosis not present

## 2017-02-19 DIAGNOSIS — R45851 Suicidal ideations: Secondary | ICD-10-CM

## 2017-02-19 DIAGNOSIS — F10120 Alcohol abuse with intoxication, uncomplicated: Secondary | ICD-10-CM | POA: Insufficient documentation

## 2017-02-19 DIAGNOSIS — J45909 Unspecified asthma, uncomplicated: Secondary | ICD-10-CM | POA: Insufficient documentation

## 2017-02-19 DIAGNOSIS — Z87891 Personal history of nicotine dependence: Secondary | ICD-10-CM | POA: Diagnosis not present

## 2017-02-19 DIAGNOSIS — R45850 Homicidal ideations: Secondary | ICD-10-CM

## 2017-02-19 DIAGNOSIS — F1410 Cocaine abuse, uncomplicated: Secondary | ICD-10-CM | POA: Diagnosis not present

## 2017-02-19 DIAGNOSIS — Z79899 Other long term (current) drug therapy: Secondary | ICD-10-CM | POA: Insufficient documentation

## 2017-02-19 LAB — RAPID URINE DRUG SCREEN, HOSP PERFORMED
Amphetamines: NOT DETECTED
Barbiturates: NOT DETECTED
Benzodiazepines: NOT DETECTED
COCAINE: POSITIVE — AB
OPIATES: NOT DETECTED
Tetrahydrocannabinol: NOT DETECTED

## 2017-02-19 LAB — COMPREHENSIVE METABOLIC PANEL
ALBUMIN: 4.7 g/dL (ref 3.5–5.0)
ALK PHOS: 49 U/L (ref 38–126)
ALT: 16 U/L — ABNORMAL LOW (ref 17–63)
ANION GAP: 11 (ref 5–15)
AST: 19 U/L (ref 15–41)
BILIRUBIN TOTAL: 0.5 mg/dL (ref 0.3–1.2)
BUN: 10 mg/dL (ref 6–20)
CALCIUM: 9.1 mg/dL (ref 8.9–10.3)
CO2: 24 mmol/L (ref 22–32)
CREATININE: 0.96 mg/dL (ref 0.61–1.24)
Chloride: 107 mmol/L (ref 101–111)
GFR calc Af Amer: >60 mL/min (ref >60)
GFR calc non Af Amer: >60 mL/min (ref >60)
GLUCOSE: 99 mg/dL (ref 65–99)
Potassium: 3.7 mmol/L (ref 3.5–5.1)
Sodium: 142 mmol/L (ref 135–145)
TOTAL PROTEIN: 7.1 g/dL (ref 6.5–8.1)

## 2017-02-19 LAB — CBC
HCT: 45.5 % (ref 39.0–52.0)
Hemoglobin: 16 g/dL (ref 13.0–17.0)
MCH: 30.5 pg (ref 26.0–34.0)
MCHC: 35.2 g/dL (ref 30.0–36.0)
MCV: 86.7 fL (ref 78.0–100.0)
PLATELETS: 221 10*3/uL (ref 150–400)
RBC: 5.25 MIL/uL (ref 4.22–5.81)
RDW: 12.3 % (ref 11.5–15.5)
WBC: 8.7 10*3/uL (ref 4.0–10.5)

## 2017-02-19 MED ORDER — IBUPROFEN 400 MG PO TABS: 600.0000 mg | ORAL_TABLET | Freq: Three times a day (TID) | ORAL | Status: DC | PRN

## 2017-02-19 MED ORDER — ALBUTEROL SULFATE HFA 108 (90 BASE) MCG/ACT IN AERS: 2.0000 | INHALATION_SPRAY | Freq: Four times a day (QID) | RESPIRATORY_TRACT | Status: DC | PRN

## 2017-02-19 MED ORDER — ALUM & MAG HYDROXIDE-SIMETH 200-200-20 MG/5ML PO SUSP: 30.0000 mL | ORAL | Status: DC | PRN

## 2017-02-19 MED ORDER — ZOLPIDEM TARTRATE 5 MG PO TABS
5.0000 mg | ORAL_TABLET | Freq: Every evening | ORAL | Status: DC | PRN
  Administered 2017-02-20: 5 mg via ORAL
  Filled 2017-02-19: qty 1

## 2017-02-19 MED ORDER — PAROXETINE HCL 10 MG PO TABS
10.0000 mg | ORAL_TABLET | Freq: Every day | ORAL | Status: DC
  Administered 2017-02-20 – 2017-02-21 (×2): 10 mg via ORAL
  Filled 2017-02-19 (×5): qty 1

## 2017-02-19 MED ORDER — LORAZEPAM 1 MG PO TABS: 1.0000 mg | ORAL_TABLET | Freq: Three times a day (TID) | ORAL | Status: DC | PRN

## 2017-02-19 MED ORDER — ONDANSETRON HCL 4 MG PO TABS: 4.0000 mg | ORAL_TABLET | Freq: Three times a day (TID) | ORAL | Status: DC | PRN

## 2017-02-19 MED ORDER — ACETAMINOPHEN 325 MG PO TABS: 650.0000 mg | ORAL_TABLET | ORAL | Status: DC | PRN

## 2017-02-19 NOTE — ED Notes (Signed)
Pt wanded by security, belongings secured, cords in room put away out of patients reach, sitter at bedside
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
## 2017-02-20 LAB — ACETAMINOPHEN LEVEL

## 2017-02-20 LAB — SALICYLATE LEVEL

## 2017-02-20 LAB — ETHANOL: Alcohol, Ethyl (B): 212 mg/dL — ABNORMAL HIGH (ref <5)

## 2017-02-20 NOTE — ED Notes (Signed)
Pt cooperative but becoming more agitated. [facility]CSO continues to be at bedside

## 2017-02-20 NOTE — Progress Notes (Signed)
Patient was recommended inpatient treatment and [facility] the following [facility]: [facility], [facility], [facility], [facility], [facility].  CSW in disposition will continue to seek placement.  Melbourne Abtsatia Naarah Borgerding, LCSWA Disposition staff 02/20/2017 10:05 AM

## 2017-02-20 NOTE — ED Notes (Signed)
Pt updated on plan of care. Pt states he feels a lot of his Si problems occur after he has been drinking. States [place].

## 2017-02-20 NOTE — ED Notes (Signed)
[facility] called, [facility]. There is no bed or accepting doctor information. AC will call with that info when available.

## 2017-02-20 NOTE — ED Notes (Signed)
tts at bedside 

## 2017-02-20 NOTE — ED Notes (Signed)
Patient provided with meal, ate 100% of it.

## 2017-02-20 NOTE — ED Notes (Signed)
Pt given meal tray.
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
## 2017-02-20 NOTE — Progress Notes (Signed)
Patient has been [facility] will call with complete accepting information when there are discharges.  [facility] RN Leotis ShamesLauren was notified.  [place] Abtsatia Yusif Gnau, LCSWA Disposition staff 02/20/2017 10:23 AM

## 2017-02-20 NOTE — ED Notes (Signed)
Pt given breakfast tray

## 2017-02-20 NOTE — ED Notes (Signed)
Pt sleeping at this time. RCSO at bedside.

## 2017-02-20 NOTE — ED Notes (Addendum)
Pt returned from shower at this time, linens changed

## 2017-02-21 ENCOUNTER — Inpatient Hospital Stay (HOSPITAL_COMMUNITY)
Admission: AD | Admit: 2017-02-21 | Discharge: 2017-02-25 | DRG: 897 | Disposition: A | Discharge status: Home / Self Care | Payer: 59 | Attending: Psychiatry | Admitting: Psychiatry

## 2017-02-21 ENCOUNTER — Encounter (HOSPITAL_COMMUNITY): Payer: Self-pay

## 2017-02-21 DIAGNOSIS — Z880 Allergy status to penicillin: Secondary | ICD-10-CM | POA: Diagnosis not present

## 2017-02-21 DIAGNOSIS — G4700 Insomnia, unspecified: Secondary | ICD-10-CM | POA: Diagnosis present

## 2017-02-21 DIAGNOSIS — J45909 Unspecified asthma, uncomplicated: Secondary | ICD-10-CM | POA: Diagnosis present

## 2017-02-21 DIAGNOSIS — Z79899 Other long term (current) drug therapy: Secondary | ICD-10-CM

## 2017-02-21 DIAGNOSIS — F329 Major depressive disorder, single episode, unspecified: Secondary | ICD-10-CM | POA: Diagnosis present

## 2017-02-21 DIAGNOSIS — F1020 Alcohol dependence, uncomplicated: Principal | ICD-10-CM | POA: Diagnosis present

## 2017-02-21 DIAGNOSIS — Z87891 Personal history of nicotine dependence: Secondary | ICD-10-CM | POA: Diagnosis not present

## 2017-02-21 DIAGNOSIS — F332 Major depressive disorder, recurrent severe without psychotic features: Secondary | ICD-10-CM | POA: Diagnosis not present

## 2017-02-21 DIAGNOSIS — Y907 Blood alcohol level of 200-239 mg/100 ml: Secondary | ICD-10-CM | POA: Diagnosis present

## 2017-02-21 MED ORDER — ALBUTEROL SULFATE HFA 108 (90 BASE) MCG/ACT IN AERS: 1.0000 | INHALATION_SPRAY | Freq: Four times a day (QID) | RESPIRATORY_TRACT | Status: DC | PRN

## 2017-02-21 MED ORDER — ACETAMINOPHEN 325 MG PO TABS: 650.0000 mg | ORAL_TABLET | Freq: Four times a day (QID) | ORAL | Status: DC | PRN

## 2017-02-21 MED ORDER — TRAZODONE HCL 50 MG PO TABS
50.0000 mg | ORAL_TABLET | Freq: Every evening | ORAL | Status: DC | PRN
  Administered 2017-02-21 – 2017-02-24 (×4): 50 mg via ORAL
  Filled 2017-02-21 (×4): qty 1

## 2017-02-21 MED ORDER — PAROXETINE HCL 10 MG PO TABS
10.0000 mg | ORAL_TABLET | Freq: Every day | ORAL | Status: DC
  Administered 2017-02-22: 10 mg via ORAL
  Filled 2017-02-21 (×3): qty 1

## 2017-02-21 MED ORDER — ALUM & MAG HYDROXIDE-SIMETH 200-200-20 MG/5ML PO SUSP: 30.0000 mL | ORAL | Status: DC | PRN

## 2017-02-21 MED ORDER — MAGNESIUM HYDROXIDE 400 MG/5ML PO SUSP: 30.0000 mL | Freq: Every day | ORAL | Status: DC | PRN

## 2017-02-21 MED ORDER — LORAZEPAM 0.5 MG PO TABS: 0.5000 mg | ORAL_TABLET | Freq: Four times a day (QID) | ORAL | Status: DC | PRN

## 2017-02-21 NOTE — ED Notes (Signed)
Pt leaving at this time with [facility]: [facility] given: EMTALA form, med necessity form, facesheet, carelink/transfer, e-signature, IVC paperwork for facility and IVC paperwork for [facility].  PT alert and oriented at discharge, ambulatory, calm, cooperative.

## 2017-02-21 NOTE — Tx Team (Signed)
Initial Treatment Plan 02/21/2017 5:18 PM Juan EmmerJames R [place] [id number]    PATIENT STRESSORS: Financial difficulties Substance abuse   PATIENT STRENGTHS: Wellsite geologistCommunication skills General fund of knowledge Physical Health Supportive family/friends Work skills   PATIENT IDENTIFIED PROBLEMS: Depression  Substance abuse  Suicidal ideation  "Good mindset"  "Get back to my 9 months clean again"             DISCHARGE CRITERIA:  Improved stabilization in mood, thinking, and/or behavior Verbal commitment to aftercare and medication compliance Withdrawal symptoms are absent or subacute and managed without 24-hour nursing intervention  PRELIMINARY DISCHARGE PLAN: Outpatient therapy Medication management  PATIENT/FAMILY INVOLVEMENT: This treatment plan has been presented to and reviewed with the patient, Juan EmmerJames R Conran.  The patient and family have been given the opportunity to ask questions and make suggestions.  Levin BaconHeather V Wandalee Klang, RN 02/21/2017, 5:18 PM
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
## 2017-02-21 NOTE — ED Notes (Signed)
Dispo was set to discharge in error, corrected to "transfer" via Dr. Ranae PalmsYelverton.

## 2017-02-21 NOTE — Plan of Care (Signed)
Problem: Activity: Goal: Sleeping patterns will improve Outcome: Progressing Pt. asked for a PRN Trazodone at bedtime.

## 2017-02-21 NOTE — ED Notes (Signed)
Pt ambulated to bathroom 

## 2017-02-21 NOTE — ED Notes (Signed)
Meal tray at bedside.  

## 2017-02-21 NOTE — BH Assessment (Signed)
Pt [facility] Attending will be Dr. Jama Flavorsobos and Accepting is De BurrsLaurie Park NP. Report # [id number]. RN notified. Pt can be transported as soon as possible.   [address]Ikeya Brockel TrooperLPC, LCAS

## 2017-02-21 NOTE — Progress Notes (Signed)
Adult Psychoeducational Group Note  Date:  02/21/2017 Time:  8:57 PM  Group Topic/Focus:  Wrap-Up Group:   The focus of this group is to help patients review their daily goal of treatment and discuss progress on daily workbooks.  Participation Level:  Active  Participation Quality:  Appropriate and Attentive  Affect:  Appropriate  Cognitive:  Appropriate  Insight: Appropriate  Engagement in Group:  Engaged  Modes of Intervention:  Discussion  Additional Comments:  Pt stated his goal for today was to learn new coping skills. Pt identified 2 coping skills: 1) working out, and 2) compromising, especially in regards to relationships.  Juan CorwinOwen, Steffi Noviello C 02/21/2017, 8:57 PM

## 2017-02-22 DIAGNOSIS — F1020 Alcohol dependence, uncomplicated: Principal | ICD-10-CM

## 2017-02-22 MED ORDER — ONDANSETRON 4 MG PO TBDP: 4.0000 mg | ORAL_TABLET | Freq: Four times a day (QID) | ORAL | Status: DC | PRN

## 2017-02-22 MED ORDER — PAROXETINE HCL 10 MG PO TABS
10.0000 mg | ORAL_TABLET | Freq: Every day | ORAL | Status: DC
  Administered 2017-02-23 – 2017-02-24 (×2): 10 mg via ORAL
  Filled 2017-02-22 (×4): qty 1

## 2017-02-22 MED ORDER — LOPERAMIDE HCL 2 MG PO CAPS: 2.0000 mg | ORAL_CAPSULE | ORAL | Status: DC | PRN

## 2017-02-22 MED ORDER — HYDROXYZINE HCL 25 MG PO TABS: 25.0000 mg | ORAL_TABLET | Freq: Four times a day (QID) | ORAL | Status: DC | PRN

## 2017-02-22 MED ORDER — THIAMINE HCL 100 MG/ML IJ SOLN
100.0000 mg | Freq: Once | INTRAMUSCULAR | Status: DC
  Filled 2017-02-22: qty 2

## 2017-02-22 MED ORDER — VITAMIN B-1 100 MG PO TABS
100.0000 mg | ORAL_TABLET | Freq: Every day | ORAL | Status: DC
  Administered 2017-02-23 – 2017-02-25 (×3): 100 mg via ORAL
  Filled 2017-02-22 (×5): qty 1

## 2017-02-22 MED ORDER — LORAZEPAM 1 MG PO TABS: 1.0000 mg | ORAL_TABLET | Freq: Four times a day (QID) | ORAL | Status: DC | PRN

## 2017-02-22 MED ORDER — ADULT MULTIVITAMIN W/MINERALS CH
1.0000 | ORAL_TABLET | Freq: Every day | ORAL | Status: DC
  Administered 2017-02-22 – 2017-02-25 (×4): 1 via ORAL
  Filled 2017-02-22 (×8): qty 1

## 2017-02-22 NOTE — Progress Notes (Signed)
DAR NOTE: Patient presents with anxious affect and mood.  Denies auditory and visual hallucinations.  Described energy level as low and concentration as poor.  Rates depression at 1, hopelessness at 1, and anxiety at 0.  Maintained on routine safety checks.  Medications given as prescribed.  Support and encouragement offered as needed.  Attended group and participated.  States goal for today is "relaxation, keep stress down."  Patient is withdrawn and keeps to himself.  Complain of lightheaded and dizziness.  Request to change medication time to bedtime.
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
## 2017-02-22 NOTE — Progress Notes (Signed)
  DATA ACTION RESPONSE  Objective- Pt. is visible in the dayroom, seen watching TV. No interaction. Presents with an anxious/depressed affect and mood. Pt. was appropriate and engaging in  rapport. No other c/o or abnormal s/s.  Subjective- Denies having any SI/HI/AVH/Pain at this time. Pt. states " Can I get something for sleep tonight?" Continues to be cooperative & pleasant.  Remains safe on the unit.  1:1 interaction in private to establish rapport. Encouragement, education, & support given from staff. . PRN Trazodone requested and will re-eval accordingly.   Safety maintained with Q 15 checks. Continues to follow treatment plan and will monitor closely. No additonal questions/concerns noted.

## 2017-02-22 NOTE — Tx Team (Signed)
Interdisciplinary Treatment and Diagnostic Plan Update  02/22/2017 Time of Session: 9:30am Juan Woods MRN: [id number]  Principal Diagnosis: Alcohol use disorder, moderate, dependence ([place])  Secondary Diagnoses: Principal Problem:   Alcohol use disorder, moderate, dependence ([place])   Current Medications:  Current Facility-Administered Medications  Medication Dose Route Frequency Provider Last Rate Last Dose  . acetaminophen (TYLENOL) tablet 650 mg  650 mg Oral Q6H PRN Niel Hummer, NP      . albuterol (PROVENTIL HFA;VENTOLIN HFA) 108 (90 Base) MCG/ACT inhaler 1-2 puff  1-2 puff Inhalation Q6H PRN Niel Hummer, NP      . alum & mag hydroxide-simeth (MAALOX/MYLANTA) 200-200-20 MG/5ML suspension 30 mL  30 mL Oral Q4H PRN Niel Hummer, NP      . hydrOXYzine (ATARAX/VISTARIL) tablet 25 mg  25 mg Oral Q6H PRN Cobos, Myer Peer, MD      . loperamide (IMODIUM) capsule 2-4 mg  2-4 mg Oral PRN Cobos, Myer Peer, MD      . LORazepam (ATIVAN) tablet 1 mg  1 mg Oral Q6H PRN Cobos, Myer Peer, MD      . magnesium hydroxide (MILK OF MAGNESIA) suspension 30 mL  30 mL Oral Daily PRN Niel Hummer, NP      . multivitamin with minerals tablet 1 tablet  1 tablet Oral Daily Cobos, Myer Peer, MD      . ondansetron (ZOFRAN-ODT) disintegrating tablet 4 mg  4 mg Oral Q6H PRN Cobos, Myer Peer, MD      . Derrill Memo ON 02/23/2017] PARoxetine (PAXIL) tablet 10 mg  10 mg Oral QHS Cobos, Fernando A, MD      . thiamine (B-1) injection 100 mg  100 mg Intramuscular Once Cobos, Myer Peer, MD      . Derrill Memo ON 02/23/2017] thiamine (VITAMIN B-1) tablet 100 mg  100 mg Oral Daily Cobos, Myer Peer, MD      . traZODone (DESYREL) tablet 50 mg  50 mg Oral QHS PRN Niel Hummer, NP   50 mg at 02/21/17 2104    PTA Medications: Prescriptions Prior to Admission  Medication Sig Dispense Refill Last Dose  . acyclovir (ZOVIRAX) 400 MG tablet Take 400 mg by mouth at bedtime.    Past Week at Unknown time  . albuterol (PROVENTIL  HFA;VENTOLIN HFA) 108 (90 BASE) MCG/ACT inhaler Inhale 1-2 puffs into the lungs every 6 (six) hours as needed for wheezing or shortness of breath.    Past Month at Unknown time  . PARoxetine (PAXIL) 20 MG tablet Take 20 mg by mouth at bedtime.   Past Week at Unknown time  . traZODone (DESYREL) 50 MG tablet Take 25 mg by mouth at bedtime.   Past Week at Unknown time    Treatment Modalities: Medication Management, Group therapy, Case management,  1 to 1 session with clinician, Psychoeducation, Recreational therapy.  Patient Stressors: Financial difficulties Substance abuse  Patient Strengths: Curator fund of knowledge Physical Health Supportive family/friends Work Artist for Primary Diagnosis: Alcohol use disorder, moderate, dependence ([place]) Long Term Goal(s): Improvement in symptoms so as ready for discharge  Short Term Goals: Ability to identify changes in lifestyle to reduce recurrence of condition will improve Ability to verbalize feelings will improve Ability to disclose and discuss suicidal ideas Ability to demonstrate self-control will improve Ability to identify and develop effective coping behaviors will improve Ability to maintain clinical measurements within normal limits will improve Compliance with prescribed medications will improve Ability to  identify triggers associated with substance abuse/mental health issues will improve Ability to identify changes in lifestyle to reduce recurrence of condition will improve Ability to verbalize feelings will improve Ability to disclose and discuss suicidal ideas Ability to demonstrate self-control will improve Ability to identify and develop effective coping behaviors will improve Ability to maintain clinical measurements within normal limits will improve Compliance with prescribed medications will improve Ability to identify triggers associated with substance abuse/mental health  issues will improve  Medication Management: Evaluate patient's response, side effects, and tolerance of medication regimen.  Therapeutic Interventions: 1 to 1 sessions, Unit Group sessions and Medication administration.  Evaluation of Outcomes: Not Met  Physician Treatment Plan for Secondary Diagnosis: Principal Problem:   Alcohol use disorder, moderate, dependence ([place])   Long Term Goal(s): Improvement in symptoms so as ready for discharge  Short Term Goals: Ability to identify changes in lifestyle to reduce recurrence of condition will improve Ability to verbalize feelings will improve Ability to disclose and discuss suicidal ideas Ability to demonstrate self-control will improve Ability to identify and develop effective coping behaviors will improve Ability to maintain clinical measurements within normal limits will improve Compliance with prescribed medications will improve Ability to identify triggers associated with substance abuse/mental health issues will improve Ability to identify changes in lifestyle to reduce recurrence of condition will improve Ability to verbalize feelings will improve Ability to disclose and discuss suicidal ideas Ability to demonstrate self-control will improve Ability to identify and develop effective coping behaviors will improve Ability to maintain clinical measurements within normal limits will improve Compliance with prescribed medications will improve Ability to identify triggers associated with substance abuse/mental health issues will improve  Medication Management: Evaluate patient's response, side effects, and tolerance of medication regimen.  Therapeutic Interventions: 1 to 1 sessions, Unit Group sessions and Medication administration.  Evaluation of Outcomes: Not Met   RN Treatment Plan for Primary Diagnosis: Alcohol use disorder, moderate, dependence ([place]) Long Term Goal(s): Knowledge of disease and therapeutic regimen to maintain  health will improve  Short Term Goals: Ability to verbalize feelings will improve, Ability to disclose and discuss suicidal ideas and Compliance with prescribed medications will improve  Medication Management: RN will administer medications as ordered by provider, will assess and evaluate patient's response and provide education to patient for prescribed medication. RN will report any adverse and/or side effects to prescribing provider.  Therapeutic Interventions: 1 on 1 counseling sessions, Psychoeducation, Medication administration, Evaluate responses to treatment, Monitor vital signs and CBGs as ordered, Perform/monitor CIWA, COWS, AIMS and Fall Risk screenings as ordered, Perform wound care treatments as ordered.  Evaluation of Outcomes: Not Met   LCSW Treatment Plan for Primary Diagnosis: Alcohol use disorder, moderate, dependence ([place]) Long Term Goal(s): Safe transition to appropriate next level of care at discharge, Engage patient in therapeutic group addressing interpersonal concerns.  Short Term Goals: Engage patient in aftercare planning with referrals and resources, Identify triggers associated with mental health/substance abuse issues and Increase skills for wellness and recovery  Therapeutic Interventions: Assess for all discharge needs, 1 to 1 time with Social worker, Explore available resources and support systems, Assess for adequacy in community support network, Educate family and significant other(s) on suicide prevention, Complete Psychosocial Assessment, Interpersonal group therapy.  Evaluation of Outcomes: Not Met   Progress in Treatment: Attending groups: Pt is new to milieu, continuing to assess  Participating in groups: Pt is new to milieu, continuing to assess  Taking medication as prescribed: Yes, MD continues to assess  for medication changes as needed Toleration medication: Yes, no side effects reported at this time Family/Significant other contact made: No, CSW  assessing for appropriate contact Patient understands diagnosis: Continuing to assess Discussing patient identified problems/goals with staff: Yes Medical problems stabilized or resolved: Yes Denies suicidal/homicidal ideation: Yes Issues/concerns per patient self-inventory: None Other: N/A  New problem(s) identified: None identified at this time.   New Short Term/Long Term Goal(s): None identified at this time.   Discharge Plan or Barriers: CSW will assess for appropriate discharge plan and relevant barriers.   Reason for Continuation of Hospitalization: Anxiety Depression Medication stabilization  Estimated Length of Stay: 3-5 days  Attendees: Patient: 02/22/2017  4:11 PM  Physician: Dr. Parke Poisson 02/22/2017  4:11 PM  Nursing: Minta Balsam, RN 02/22/2017  4:11 PM  RN Care Manager: Lars Pinks, RN 02/22/2017  4:11 PM  Social Worker: Adriana Reams, LCSW; Matthew Saras, [place] 02/22/2017  4:11 PM  Recreational Therapist:  02/22/2017  4:11 PM  Other: Lindell Spar, NP; Samuel Jester, NP 02/22/2017  4:11 PM  Other:  02/22/2017  4:11 PM  Other: 02/22/2017  4:11 PM    Scribe for Treatment Team: Gladstone Lighter, LCSW 02/22/2017 4:11 PM

## 2017-02-22 NOTE — H&P (Signed)
Psychiatric Admission Assessment Adult  Patient Identification: Juan Woods MRN:  [id number] Date of Evaluation:  02/22/2017 Chief Complaint:  MDD,rec,sev  etoh use disorder,moderate Cocaine use disorder Principal Diagnosis: Alcohol use disorder, moderate, dependence (HCC) Diagnosis:   Patient Active Problem List   Diagnosis Date Noted  . Alcohol use disorder, moderate, dependence (HCC) [F10.20] 02/21/2017    Priority: High   History of Present Illness: Juan Woods, 26 yo male patient, single, employed.  Patient BAL upon admission was 212.  Patient is pleasant.  He admits to drinking to feel good.  He has been prescribed Paxil for depression and he is compliant on the medications.  He states that it is working.  He was in a detox program and was sober for 9 months.  He states that he relapsed back in April due to work related stress.  He does not recall threatening to end his life nor does he recall threatening to hurt his parents.   He states that he does cocaine 75 % time when drinking.  "I can drink more when I do cocaine at the same time."  He states binge drinking twice weekly, 12-18 beers at a time.    Associated Signs/Symptoms: Depression Symptoms:  depressed mood, hopelessness, anxiety, (Hypo) Manic Symptoms:  Impulsivity, Anxiety Symptoms:  Excessive Worry, Psychotic Symptoms:  Paranoia, PTSD Symptoms: NA Total Time spent with patient: 45 minutes  Past Psychiatric History: see HPI  Is the patient at risk to self? Yes.    Has the patient been a risk to self in the past 6 months? Yes.    Has the patient been a risk to self within the distant past? Yes.    Is the patient a risk to others? No.  Has the patient been a risk to others in the past 6 months? No.  Has the patient been a risk to others within the distant past? No.   Prior Inpatient Therapy:   Prior Outpatient Therapy:    Alcohol Screening: 1. How often do you have a drink containing alcohol?: 2 to 3 times a  week 2. How many drinks containing alcohol do you have on a typical day when you are drinking?: 10 or more 3. How often do you have six or more drinks on one occasion?: Weekly Preliminary Score: 7 4. How often during the last year have you found that you were not able to stop drinking once you had started?: Weekly 5. How often during the last year have you failed to do what was normally expected from you becasue of drinking?: Less than monthly 6. How often during the last year have you needed a first drink in the morning to get yourself going after a heavy drinking session?: Never 7. How often during the last year have you had a feeling of guilt of remorse after drinking?: Less than monthly 8. How often during the last year have you been unable to remember what happened the night before because you had been drinking?: Monthly 9. Have you or someone else been injured as a result of your drinking?: No 10. Has a relative or friend or a doctor or another health worker been concerned about your drinking or suggested you cut down?: No Alcohol Use Disorder Identification Test Final Score (AUDIT): 17 Brief Intervention: Yes Substance Abuse History in the last 12 months:  Yes.   Consequences of Substance Abuse: NA Previous Psychotropic Medications: Yes  Psychological Evaluations: Yes  Past Medical History:  Past Medical History:  Diagnosis  Date  . Asthma     Past Surgical History:  Procedure Laterality Date  . HERNIA REPAIR    . HIP SURGERY    . TESTICLE SURGERY    . WISDOM TOOTH EXTRACTION     Family History:  Family History  Problem Relation Age of Onset  . Fibromyalgia Mother   . Hypertension Mother   . Hypertension Father    Family Psychiatric  History: see HPI Tobacco Screening: Have you used any form of tobacco in the last 30 days? (Cigarettes, Smokeless Tobacco, Cigars, and/or Pipes): Yes Tobacco use, Select all that apply: smokeless tobacco use, not daily Are you interested in  Tobacco Cessation Medications?: No, patient refused Counseled patient on smoking cessation including recognizing danger situations, developing coping skills and basic information about quitting provided: Refused/Declined practical counseling Social History:  History  Alcohol Use  . Yes    Comment: occ     History  Drug Use No    Additional Social History: Long term relationship, how long?: 5 mo What types of issues is patient dealing with in the relationship?: "She has kids and I don't have kids. Sometimes it's hard to put up with a kid when you're basically stil la kid yourself" Does patient have children?: No    Pain Medications: None Prescriptions: Trazadone and some other medications he can't recall.  Patient says he is compliant with meds. Over the Counter: Allergy medications. History of alcohol / drug use?: Yes Longest period of sobriety (when/how long): 9 months Negative Consequences of Use: Financial, Personal relationships Withdrawal Symptoms: Other (Comment) (none reported) Name of Substance 1: ETOH 1 - Age of First Use: Teens 1 - Amount (size/oz): 12-24 beers over a day  1 - Frequency: every three days 1 - Duration: 2-3 weeks "I was sober 9 months prior to this" 1 - Last Use / Amount: 02/18/17                  Allergies:   Allergies  Allergen Reactions  . Penicillins Other (See Comments)    Reaction unknown Has patient had a PCN reaction causing immediate rash, facial/tongue/throat swelling, SOB or lightheadedness with hypotension: unknown Has patient had a PCN reaction causing severe rash involving mucus membranes or skin necrosis: unknown Has patient had a PCN reaction that required hospitalization unknown Has patient had a PCN reaction occurring within the last 10 years: unknown If all of the above answers are "NO", then may proceed with Cephalosporin use.    Lab Results: No results found for this or any previous visit (from the past 48  hour(s)).  Blood Alcohol level:  Lab Results  Component Value Date   ETH 212 (H) 02/19/2017    Metabolic Disorder Labs:  No results found for: HGBA1C, MPG No results found for: PROLACTIN Lab Results  Component Value Date   CHOL 158 08/08/2013   TRIG 66 08/08/2013   HDL 53 08/08/2013   CHOLHDL 3.0 08/08/2013   LDLCALC 92 08/08/2013    Current Medications: Current Facility-Administered Medications  Medication Dose Route Frequency Provider Last Rate Last Dose  . acetaminophen (TYLENOL) tablet 650 mg  650 mg Oral Q6H PRN Fransisca Kaufmannavis, Laura A, NP      . albuterol (PROVENTIL HFA;VENTOLIN HFA) 108 (90 Base) MCG/ACT inhaler 1-2 puff  1-2 puff Inhalation Q6H PRN Thermon Leylandavis, Laura A, NP      . alum & mag hydroxide-simeth (MAALOX/MYLANTA) 200-200-20 MG/5ML suspension 30 mL  30 mL Oral Q4H PRN Thermon Leylandavis, Laura A, NP      .  LORazepam (ATIVAN) tablet 0.5 mg  0.5 mg Oral Q6H PRN Fransisca Kaufmann A, NP      . magnesium hydroxide (MILK OF MAGNESIA) suspension 30 mL  30 mL Oral Daily PRN Thermon Leyland, NP      . PARoxetine (PAXIL) tablet 10 mg  10 mg Oral Daily Fransisca Kaufmann A, NP   10 mg at 02/22/17 0835  . traZODone (DESYREL) tablet 50 mg  50 mg Oral QHS PRN Thermon Leyland, NP   50 mg at 02/21/17 2104   PTA Medications: Prescriptions Prior to Admission  Medication Sig Dispense Refill Last Dose  . acyclovir (ZOVIRAX) 400 MG tablet Take 400 mg by mouth at bedtime.    Past Week at Unknown time  . albuterol (PROVENTIL HFA;VENTOLIN HFA) 108 (90 BASE) MCG/ACT inhaler Inhale 1-2 puffs into the lungs every 6 (six) hours as needed for wheezing or shortness of breath.    Past Month at Unknown time  . PARoxetine (PAXIL) 20 MG tablet Take 20 mg by mouth at bedtime.   Past Week at Unknown time  . traZODone (DESYREL) 50 MG tablet Take 25 mg by mouth at bedtime.   Past Week at Unknown time    Musculoskeletal: Strength & Muscle Tone: within normal limits Gait & Station: normal Patient leans: N/A  Psychiatric Specialty  Exam: Physical Exam  ROS  Blood pressure (!) 88/57, pulse 77, temperature 98.1 F (36.7 C), temperature source Oral, resp. rate 18, height 6' (1.829 m), weight 77.1 kg (170 lb), SpO2 99 %.Body mass index is 23.06 kg/m.  General Appearance: Disheveled  Eye Contact:  Good  Speech:  Normal Rate  Volume:  Normal  Mood:  Anxious  Affect:  Appropriate  Thought Process:  Coherent  Orientation:  Full (Time, Place, and Person)  Thought Content:  Rumination  Suicidal Thoughts:  No  Homicidal Thoughts:  No  Memory:  Immediate;   Good Recent;   Good Remote;   Good  Judgement:  Good  Insight:  Good  Psychomotor Activity:  Normal  Concentration:  Concentration: Fair and Attention Span: Fair  Recall:  [employer] of Knowledge:  Fair  Language:  Fair  Akathisia:  No  Handed:  Right  AIMS (if indicated):     Assets:  Communication Skills  ADL's:  Intact  Cognition:  WNL  Sleep:  Number of Hours: 6.75   Treatment Plan Summary: Admit for crisis management and mood stabilization. Medication management to re-stabilize current mood symptoms Group counseling sessions for coping skills Medical consults as needed Review and reinstate any pertinent home medications for other health problems  Observation Level/Precautions:  15 minute checks  Laboratory:  per ED  Psychotherapy:  group  Medications:  Per medlist  Consultations:  As needed  Discharge Concerns:  safety  Estimated LOS:  2- 7 days  Other:     Physician Treatment Plan for Primary Diagnosis: Alcohol use disorder, moderate, dependence (HCC) Long Term Goal(s): Improvement in symptoms so as ready for discharge  Short Term Goals: Ability to identify changes in lifestyle to reduce recurrence of condition will improve, Ability to verbalize feelings will improve, Ability to disclose and discuss suicidal ideas, Ability to demonstrate self-control will improve, Ability to identify and develop effective coping behaviors will improve, Ability  to maintain clinical measurements within normal limits will improve, Compliance with prescribed medications will improve and Ability to identify triggers associated with substance abuse/mental health issues will improve  Physician Treatment Plan for Secondary Diagnosis: Principal Problem:  Alcohol use disorder, moderate, dependence (HCC)  Long Term Goal(s): Improvement in symptoms so as ready for discharge  Short Term Goals: Ability to identify changes in lifestyle to reduce recurrence of condition will improve, Ability to verbalize feelings will improve, Ability to disclose and discuss suicidal ideas, Ability to demonstrate self-control will improve, Ability to identify and develop effective coping behaviors will improve, Ability to maintain clinical measurements within normal limits will improve, Compliance with prescribed medications will improve and Ability to identify triggers associated with substance abuse/mental health issues will improve  I certify that inpatient services furnished can reasonably be expected to improve the patient's condition.    [facility], NP [facility] 5/15/20183:44 PM

## 2017-02-22 NOTE — [facility] Group Notes (Signed)
[facility] Group Therapy 02/22/2017 1:15pm  Type of Therapy: [facility] Presentation  Participation Level: Active  Participation Quality: Attentive  Affect: Appropriate  Cognitive: Oriented  Insight: Developing/Improving  Engagement in Therapy: Engaged  Modes of Intervention: Discussion, Education and Socialization  Summary of Progress/Problems: [facility] [employer] ([facility]) Speaker came to talk about his personal journey with substance abuse and addiction. The pt processed ways by which to relate to the speaker. MHA speaker provided handouts and educational information pertaining to groups and services offered by the MHA. Pt was engaged in speaker's presentation and was receptive to resources provided.    Juan Mcclune, LCSW 02/22/2017 2:44 PM  

## 2017-02-22 NOTE — Progress Notes (Signed)
Recreation Therapy Notes  Animal-Assisted Activity (AAA) Program Checklist/Progress Notes Patient Eligibility Criteria Checklist & Daily Group note for Rec TxIntervention  Date: 05.15.2018 Time: 2:45pm [address]    AAA/T Program Assumption of Risk Form signed by Patient/ or Parent Legal Guardian Yes  Patient is free of allergies or sever asthma Yes  Patient reports no fear of animals Yes  Patient reports no history of cruelty to animals Yes  Patient understands his/her participation is voluntary Yes  Behavioral Response: Did not attend.   Marykay Lexenise L Dasia Guerrier, LRT/CTRS        Jearl KlinefelterBlanchfield, Tinika Bucknam L 02/22/2017 2:55 PM

## 2017-02-22 NOTE — [facility] Suicide Risk Assessment (Signed)
[facility]   Nursing information obtained from:  Patient Demographic factors:  Male, Caucasian, Access to firearms Current Mental Status:  Suicidal ideation indicated by patient Loss Factors:  Financial problems / change in socioeconomic status Historical Factors:  Impulsivity, Family history of mental illness or substance abuse Risk Reduction Factors:  Employed, Living with another person, especially a relative  Total Time spent with patient: 45 minutes  Principal Problem:  Alcohol Use Disorder  Diagnosis:   Patient Active Problem List   Diagnosis Date Noted  . Alcohol use disorder, moderate, dependence (HCC) [F10.20] 02/21/2017    Continued Clinical Symptoms:  Alcohol Use Disorder Identification Test Final Score (AUDIT): 17 The "Alcohol Use Disorders Identification Test", Guidelines for Use in Primary Care, Second Edition.  World Science writerHealth Organization [facility](WHO). Score between 0-7:  no or low risk or alcohol related problems. Score between 8-15:  moderate risk of alcohol related problems. Score between 16-19:  high risk of alcohol related problems. Score 20 or above:  warrants further diagnostic evaluation for alcohol dependence and treatment.   CLINICAL FACTORS:  26 year old male, single, no children, lives with parents, employed. Patient states he has very little recollection regarding events that led to admission, and states " I was blacked out". Admission BAL 212, UDS positive for cocaine.  Reports indicate he made threats to kill self ( was found with a firearm)  and also made threatening remarks to parents. States " I really don't remember, and I would never threaten them in my sober mind ". States he thinks he was upset because his parents had taken his keys from him. He states he does not feel he had been depressed recently, and states " I have actually been OK, happy recently". States he does have a history of depression, but has been stable on Paxil,  which he has been on for several years .  Reports history of alcohol use disorder. States he had been sober x 9 months, until mid April when he relapsed. Since then he has been drinking heavily  2-3 x a week ( up to 12 beers per day).  States he has been using cocaine several times since last month.  Denies medical illnesses . Possible allergy to PCN, but is unsure.  Dx- Alcohol Dependence, Alcohol Induced Mood Disorder, Suicidal Ideations  Plan- inpatient admission Continue Paxil 20 mgrs QDAY for depression, anxiety. Ativan Detox protocol ( PRN )       Musculoskeletal: Strength & Muscle Tone: within normal limits Gait & Station: normal Patient leans: N/A  Psychiatric Specialty Exam: Physical Exam  ROS denies tremors, no diaphoresis, no chest pain, no shortness of breath   Blood pressure (!) 88/57, pulse 77, temperature 98.1 F (36.7 C), temperature source Oral, resp. rate 18, height 6' (1.829 m), weight 77.1 kg (170 lb), SpO2 99 %.Body mass index is 23.06 kg/m.  General Appearance: Well Groomed  Eye Contact:  Good  Speech:  Normal Rate  Volume:  Normal  Mood:  minimizes depression at this time  Affect:  Appropriate  Thought Process:  Linear and Descriptions of Associations: Intact  Orientation:  Full (Time, Place, and Person)  Thought Content:  denies any hallucinations, no delusions , not internally preoccupied   Suicidal Thoughts:  No denies any suicidal or self injurious ideations, denies any homicidal or violent ideations , specifically denies any violent or homicidal ideations towards his parents   Homicidal Thoughts:  No  Memory:  recent and remote grossly intact  Judgement:  Fair  Insight:  Fair- improving   Psychomotor Activity:  Normal  Concentration:  Concentration: Good and Attention Span: Good  Recall:  Good  Fund of Knowledge:  Good  Language:  Good  Akathisia:  No  Handed:  Right  AIMS (if indicated):     Assets:  Communication Skills Physical  Health Resilience  ADL's:  Intact  Cognition:  WNL  Sleep:  Number of Hours: 6.75      COGNITIVE FEATURES THAT CONTRIBUTE TO RISK:  Closed-mindedness and Loss of executive function    SUICIDE RISK:   Moderate:  Frequent suicidal ideation with limited intensity, and duration, some specificity in terms of plans, no associated intent, good self-control, limited dysphoria/symptomatology, some risk factors present, and identifiable protective factors, including available and accessible social support.  PLAN OF CARE: Patient will be admitted to inpatient psychiatric unit for stabilization and safety. Will provide and encourage milieu participation. Provide medication management and maked adjustments as needed.  Will follow daily.    I certify that inpatient services furnished can reasonably be expected to improve the patient's condition.   Craige Cotta, MD 02/22/2017, 3:24 PM

## 2017-02-23 MED ORDER — ACYCLOVIR 200 MG PO CAPS
400.0000 mg | ORAL_CAPSULE | Freq: Every day | ORAL | Status: DC
  Administered 2017-02-23 – 2017-02-24 (×2): 400 mg via ORAL
  Filled 2017-02-23 (×5): qty 2

## 2017-02-23 MED ORDER — ACYCLOVIR 400 MG PO TABS: 400.0000 mg | ORAL_TABLET | Freq: Every day | ORAL | Status: DC

## 2017-02-23 NOTE — Progress Notes (Signed)
   D: When asked about his day pt stated he had a "good day". Questioned Retail bankerthe writer about his paxil and zovirax. Was pleased to hear that he was getting his antiviral. Also dicussed taking his trazodone earlier tonight. Pt has no other questions or concerns.    A:  Support and encouragement was offered. 15 min checks continued for safety.  R: Pt remains safe.

## 2017-02-23 NOTE — Progress Notes (Signed)
[facility] MD Progress Note  02/23/2017 4:37 PM Juan Woods  MRN:  [id number] Subjective:  Patient states he is feeling "OK". He does endorse a vague subjective sense of irritability and feeling " tired ". Denies medication side effects . States he had good visit from family yesterday. At this time denies suicidal ideations, contracts for safety, but states he does sometimes think about the meaning of life and whether there " is any purpose in being alive ". Objective : I have discussed case with treatment team and have met with patient. Patient presents alert, attentive, cooperative. Currently not presenting with any significant alcohol WDL symptoms- no tremors , no diaphoresis, no restlessness or agitation. Vitals stable. No disruptive or agitated behaviors on unit, going to some groups. Patient acknowledges that he has a problem with alcohol , and we discussed the importance of abstinence as treatment goal. He states " none of this " ( referring to events leading to admission) would have occurred if he had not consumed alcohol.  Denies suicidal ideations, minimizes depression, but does have ruminations about death, meaning of life issues. States " I guess it is because I am interested in philosophy". Denies any current plan or intention of suicide or of hurting self. Denies alcohol cravings .  Principal Problem: Alcohol use disorder, moderate, dependence ([place]) Diagnosis:   Patient Active Problem List   Diagnosis Date Noted  . Alcohol use disorder, moderate, dependence ([place]) [F10.20] 02/21/2017   Total Time spent with patient: 20 minutes  Past Medical History:  Past Medical History:  Diagnosis Date  . Asthma     Past Surgical History:  Procedure Laterality Date  . HERNIA REPAIR    . HIP SURGERY    . TESTICLE SURGERY    . WISDOM TOOTH EXTRACTION     Family History:  Family History  Problem Relation Age of Onset  . Fibromyalgia Mother   . Hypertension Mother   . Hypertension Father     Social History:  History  Alcohol Use  . Yes    Comment: occ     History  Drug Use No    Social History   Social History  . Marital status: Single    Spouse name: N/A  . Number of children: N/A  . Years of education: N/A   Social History Main Topics  . Smoking status: Never Smoker  . Smokeless tobacco: Former Systems developer    Quit date: 02/07/2017  . Alcohol use Yes     Comment: occ  . Drug use: No  . Sexual activity: Yes    Birth control/ protection: None   Other Topics Concern  . None   Social History Narrative  . None   Additional Social History:    Pain Medications: None Prescriptions: Trazadone and some other medications he can't recall.  Patient says he is compliant with meds. Over the Counter: Allergy medications. History of alcohol / drug use?: Yes Longest period of sobriety (when/how long): 9 months Negative Consequences of Use: Financial, Personal relationships Withdrawal Symptoms: Other (Comment) (none reported) Name of Substance 1: ETOH 1 - Age of First Use: Teens 1 - Amount (size/oz): 12-24 beers over a day  1 - Frequency: every three days 1 - Duration: 2-3 weeks "I was sober 9 months prior to this" 1 - Last Use / Amount: 02/18/17  Sleep: Good  Appetite:  Good  Current Medications: Current Facility-Administered Medications  Medication Dose Route Frequency Provider Last Rate Last Dose  . acetaminophen (TYLENOL) tablet  650 mg  650 mg Oral Q6H PRN Elmarie Shiley A, NP      . acyclovir (ZOVIRAX) 200 MG capsule 400 mg  400 mg Oral QHS Cobos, Myer Peer, MD      . albuterol (PROVENTIL HFA;VENTOLIN HFA) 108 (90 Base) MCG/ACT inhaler 1-2 puff  1-2 puff Inhalation Q6H PRN Niel Hummer, NP      . alum & mag hydroxide-simeth (MAALOX/MYLANTA) 200-200-20 MG/5ML suspension 30 mL  30 mL Oral Q4H PRN Niel Hummer, NP      . hydrOXYzine (ATARAX/VISTARIL) tablet 25 mg  25 mg Oral Q6H PRN Cobos, Myer Peer, MD      . loperamide (IMODIUM) capsule 2-4 mg  2-4 mg Oral  PRN Cobos, Myer Peer, MD      . LORazepam (ATIVAN) tablet 1 mg  1 mg Oral Q6H PRN Cobos, Myer Peer, MD      . magnesium hydroxide (MILK OF MAGNESIA) suspension 30 mL  30 mL Oral Daily PRN Niel Hummer, NP      . multivitamin with minerals tablet 1 tablet  1 tablet Oral Daily Cobos, Myer Peer, MD   1 tablet at 02/23/17 1109  . ondansetron (ZOFRAN-ODT) disintegrating tablet 4 mg  4 mg Oral Q6H PRN Cobos, Myer Peer, MD      . PARoxetine (PAXIL) tablet 10 mg  10 mg Oral QHS Cobos, Fernando A, MD      . thiamine (B-1) injection 100 mg  100 mg Intramuscular Once Cobos, Fernando A, MD      . thiamine (VITAMIN B-1) tablet 100 mg  100 mg Oral Daily Cobos, Myer Peer, MD   100 mg at 02/23/17 1109  . traZODone (DESYREL) tablet 50 mg  50 mg Oral QHS PRN Elmarie Shiley A, NP   50 mg at 02/22/17 2200    Lab Results: No results found for this or any previous visit (from the past 48 hour(s)).  Blood Alcohol level:  Lab Results  Component Value Date   ETH 212 (H) 47/06/2956    Metabolic Disorder Labs: No results found for: HGBA1C, MPG No results found for: PROLACTIN Lab Results  Component Value Date   CHOL 158 08/08/2013   TRIG 66 08/08/2013   HDL 53 08/08/2013   CHOLHDL 3.0 08/08/2013   LDLCALC 92 08/08/2013    Physical Findings: AIMS: Facial and Oral Movements Muscles of Facial Expression: None, normal Lips and Perioral Area: None, normal Jaw: None, normal Tongue: None, normal,Extremity Movements Upper (arms, wrists, hands, fingers): None, normal Lower (legs, knees, ankles, toes): None, normal, Trunk Movements Neck, shoulders, hips: None, normal, Overall Severity Severity of abnormal movements (highest score from questions above): None, normal Incapacitation due to abnormal movements: None, normal Patient's awareness of abnormal movements (rate only patient's report): No Awareness, Dental Status Current problems with teeth and/or dentures?: No Does patient usually wear dentures?: No   CIWA:  CIWA-Ar Total: 2 COWS:     Musculoskeletal: Strength & Muscle Tone: within normal limits no tremors, no diaphoresis, no acute distress or agitation  Gait & Station: normal Patient leans: N/A  Psychiatric Specialty Exam: Physical Exam  ROS denies headache, denies visual disturbances, no vomiting   Blood pressure (!) 122/58, pulse 62, temperature 98.5 F (36.9 C), temperature source Oral, resp. rate 18, height 6' (1.829 m), weight 77.1 kg (170 lb), SpO2 99 %.Body mass index is 23.06 kg/m.  General Appearance: Well Groomed  Eye Contact:  Good  Speech:  Normal Rate  Volume:  Normal  Mood:  minimizes depression, describes mood as "OK"  Affect:  somewhat constricted, does smile briefly at times   Thought Process:  Linear and Descriptions of Associations: Intact  Orientation:  Other:  fully alert and attentive   Thought Content:  denies hallucinations, no delusions expressed   Suicidal Thoughts:  No denies suicidal or self injurious ideations, denies any homicidal ideations, specifically also denies any homicidal or violent ideations towards parents   Homicidal Thoughts:  No  Memory:  recent and remote grossly intact   Judgement:  Fair- improving   Insight:  improving  Psychomotor Activity:  Normal- not currently presenting with symptoms of acute alcohol withdrawal  Concentration:  Concentration: Good and Attention Span: Good  Recall:  Good  Fund of Knowledge:  Good  Language:  Good  Akathisia:  No  Handed:  Right  AIMS (if indicated):     Assets:  Communication Skills Desire for Improvement Physical Health Resilience  ADL's:  Intact  Cognition:  WNL  Sleep:  Number of Hours: 6.75   Assessment - patient not presenting with symptoms of alcohol WDL at this time, presents calm, comfortable, with stable vitals, and in no acute distress or discomfort. Minimizes depression at this time, but does present vaguely constricted in affect and describes " philosophical" questions  about the purpose and meaning of life. Denies any actual suicidal ideations, and is future oriented . Denies medication side effects.   Treatment Plan Summary: Daily contact with patient to assess and evaluate symptoms and progress in treatment, Medication management, Plan inpatient treatment  and medications as below Encourage group and milieu participation to work on coping skills and symptom reduction  Continue Ativan detox protocol ( PRN) for potential symptoms of alcohol WDL Continue Paxil 10 mgrs QHS ( prefers QHS dosing ) to help address depression, anxiety symptoms Continue Trazodone 50 mgrs QHS PRN for insomnia as needed  Continue to encourage efforts to address alcohol use disorder, encourage abstinence Treatment team working on disposition planning options  Jenne Campus, MD 02/23/2017, 4:37 PM

## 2017-02-23 NOTE — Progress Notes (Signed)
D: Patient up and visible in the milieu after initially sleeping late. Spoke with patient 1:1. Rating depression, hopelessness both at a 010 and anxiety at a 4/10. Rates sleep as good, appetite as good, energy as normal and concentration as poor. Patient's affect appropriate with congruent mood. Initially complained of anxiety but denied need for prn. States goal for today is to "stay positive, work on coping skills, smile and think positive." Denies pain, physical problems.   A: Medicated per orders, no prns requested or required. Emotional support offered and self inventory reviewed. Encouraged completion of Suicide Safety Plan. Discussed POC with MD, SW.    R: Patient verbalizes understanding of POC. Patient denies SI/HI and remains safe on level III obs.

## 2017-02-23 NOTE — Progress Notes (Signed)
Pt attend wrap up group. His day was a 8.. His goal was to have a better mood and he accomplished his goal.

## 2017-02-23 NOTE — Plan of Care (Signed)
Problem: Safety: Goal: Periods of time without injury will increase Outcome: Progressing Patient has not engaged in self harm, denies SI.  Problem: Medication: Goal: Compliance with prescribed medication regimen will improve Outcome: Progressing Patient is med compliant.   

## 2017-02-23 NOTE — [facility] Suicide Risk Assessment (Signed)
[facility] INPATIENT:  Family/Significant Other Suicide Prevention Education  Suicide Prevention Education:  Education Completed; [name], Pt's mother [phone], has been identified by the patient as the family member/significant other with whom the patient will be residing, and identified as the person(s) who will aid the patient in the event of a mental health crisis (suicidal ideations/suicide attempt).  With written consent from the patient, the family member/significant other has been provided the following suicide prevention education, prior to the and/or following the discharge of the patient.  The suicide prevention education provided includes the following:  Suicide risk factors  Suicide prevention and interventions  National Suicide Hotline telephone number  [facility] telephone number  [facility] Assistance 911  [facility] and/or [facility] telephone number  Request made of family/significant other to:  Remove weapons (e.g., guns, rifles, knives), all items previously/currently identified as safety concern.    Remove drugs/medications (over-the-counter, prescriptions, illicit drugs), all items previously/currently identified as a safety concern.  The family member/significant other verbalizes understanding of the suicide prevention education information provided.  The family member/significant other agrees to remove the items of safety concern listed above.  Juan Woods 02/23/2017, 3:54 PM

## 2017-02-23 NOTE — [facility] Group Notes (Signed)
[facility] LCSW Group Therapy 02/23/2017 1:15 PM  Type of Therapy: Group Therapy- Emotion Regulation  Participation Level: Active   Participation Quality:  Appropriate  Affect: Appropriate  Cognitive: Alert and Oriented   Insight:  Developing/Improving  Engagement in Therapy: Developing/Improving and Engaged   Modes of Intervention: Clarification, Confrontation, Discussion, Education, Exploration, Limit-setting, Orientation, Problem-solving, Rapport Building, Dance movement psychotherapisteality Testing, Socialization and Support  Summary of Progress/Problems: The topic for group today was emotional regulation. This group focused on both positive and negative emotion identification and allowed group members to process ways to identify feelings, regulate negative emotions, and find healthy ways to manage internal/external emotions. Group members were asked to reflect on a time when their reaction to an emotion led to a negative outcome and explored how alternative responses using emotion regulation would have benefited them. Group members were also asked to discuss a time when emotion regulation was utilized when a negative emotion was experienced. Pt participated actively in group discussion and was able to articulate symptoms and warning signs related to anger and anxiety. He was recepetive to feedback from peers related to helpful tools to regulate emotions.    Vernie ShanksLauren Michale Weikel, LCSW 02/23/2017 4:04 PM

## 2017-02-23 NOTE — Progress Notes (Signed)
Recreation Therapy Notes  Date: 02/23/17 Time: 0930 Location: [address] Dayroom  Group Topic: Stress Management  Goal Area(s) Addresses:  Patient will verbalize importance of using healthy stress management.  Patient will identify positive emotions associated with healthy stress management.   Intervention: Stress Management  Activity :  Body Scan Meditation.  LRT introduced the stress management technique of meditation.  LRT played a meditation from the Calm app to allow patients the opportunity to scan and acknowledge the sensations and tension within the body.  Patients were to follow along as the meditation played to engage in the activity.  Education:  Stress Management, Discharge Planning.   Education Outcome: Acknowledges edcuation/In group clarification offered/Needs additional education  Clinical Observations/Feedback: Pt did not attend group.   Hayslee Casebolt, LRT/CTRS         Rondia Higginbotham A 02/23/2017 12:50 PM 

## 2017-02-23 NOTE — Progress Notes (Signed)
  D: Pt was pleasant but depressed during the assessment. Pt informed the writer of the change to adm of paxil.Writer informed the pt that his medication had been changed to hs starting tomorrow.  Encouraged Clinical research associatewriter to give prn trazodone early because, "it makes it hard for me to get up in the morning". Pt has no questions or concerns.    A:  Support and encouragement was offered. 15 min checks continued for safety.  R: Pt remains safe.

## 2017-02-24 DIAGNOSIS — Z87891 Personal history of nicotine dependence: Secondary | ICD-10-CM

## 2017-02-24 NOTE — Progress Notes (Signed)
[facility] MD Progress Note  02/24/2017 5:01 PM Juan Woods  MRN:  [id number]  Subjective:  Patient states, "I'm doing much better today than I did yesterday. My mood is improving gradually. I'm less drowsy now that my depression medicine is switched back to nighttime. At this time denies suicidal ideations, contracts for safety, but states he does sometimes think about the meaning of life and whether there " is any purpose in being alive, but that does not mean that I will hurt myself or anything of that nature".  Objective : I have discussed case with treatment team and have met with patient. Patient presents alert, attentive, cooperative. Currently not presenting with any significant alcohol WDL symptoms- no tremors, no diaphoresis, no restlessness or agitation. Vitals stable. No disruptive or agitated behaviors on unit, going to some groups. Patient acknowledges that he has a problem with alcohol, and we discussed the importance of abstinence as treatment goal. He states " none of this "(referring to events leading to admission) would have occurred if he had not consumed alcohol.  Denies suicidal ideations, minimizes depression, but does have ruminations about death, meaning of life issues. States " I guess it is because I am interested in philosophy". Denies any current plan or intention of suicide or of hurting self. Denies alcohol cravings. He says now that he is thinking clearly, his plan after discharge is to make at least 2 AA meetings in a week & will make an effort to join the celebrating recovery within his church.  Principal Problem: Alcohol use disorder, moderate, dependence ([place]) Diagnosis:   Patient Active Problem List   Diagnosis Date Noted  . Alcohol use disorder, moderate, dependence ([place]) [F10.20] 02/21/2017   Total Time spent with patient: 15 minutes  Past Medical History:  Past Medical History:  Diagnosis Date  . Asthma     Past Surgical History:  Procedure Laterality Date   . HERNIA REPAIR    . HIP SURGERY    . TESTICLE SURGERY    . WISDOM TOOTH EXTRACTION     Family History:  Family History  Problem Relation Age of Onset  . Fibromyalgia Mother   . Hypertension Mother   . Hypertension Father    Social History:  History  Alcohol Use  . Yes    Comment: occ     History  Drug Use No    Social History   Social History  . Marital status: Single    Spouse name: N/A  . Number of children: N/A  . Years of education: N/A   Social History Main Topics  . Smoking status: Never Smoker  . Smokeless tobacco: Former Systems developer    Quit date: 02/07/2017  . Alcohol use Yes     Comment: occ  . Drug use: No  . Sexual activity: Yes    Birth control/ protection: None   Other Topics Concern  . None   Social History Narrative  . None   Additional Social History:    Pain Medications: None Prescriptions: Trazadone and some other medications he can't recall.  Patient says he is compliant with meds. Over the Counter: Allergy medications. History of alcohol / drug use?: Yes Longest period of sobriety (when/how long): 9 months Negative Consequences of Use: Financial, Personal relationships Withdrawal Symptoms: Other (Comment) (none reported) Name of Substance 1: ETOH 1 - Age of First Use: Teens 1 - Amount (size/oz): 12-24 beers over a day  1 - Frequency: every three days 1 - Duration: 2-3 weeks "  I was sober 9 months prior to this" 1 - Last Use / Amount: 02/18/17  Sleep: Good  Appetite:  Good  Current Medications: Current Facility-Administered Medications  Medication Dose Route Frequency Provider Last Rate Last Dose  . acetaminophen (TYLENOL) tablet 650 mg  650 mg Oral Q6H PRN Elmarie Shiley A, NP      . acyclovir (ZOVIRAX) 200 MG capsule 400 mg  400 mg Oral QHS Cobos, Myer Peer, MD   400 mg at 02/23/17 2150  . albuterol (PROVENTIL HFA;VENTOLIN HFA) 108 (90 Base) MCG/ACT inhaler 1-2 puff  1-2 puff Inhalation Q6H PRN Niel Hummer, NP      . alum & mag  hydroxide-simeth (MAALOX/MYLANTA) 200-200-20 MG/5ML suspension 30 mL  30 mL Oral Q4H PRN Niel Hummer, NP      . hydrOXYzine (ATARAX/VISTARIL) tablet 25 mg  25 mg Oral Q6H PRN Cobos, Myer Peer, MD      . loperamide (IMODIUM) capsule 2-4 mg  2-4 mg Oral PRN Cobos, Myer Peer, MD      . LORazepam (ATIVAN) tablet 1 mg  1 mg Oral Q6H PRN Cobos, Myer Peer, MD      . magnesium hydroxide (MILK OF MAGNESIA) suspension 30 mL  30 mL Oral Daily PRN Niel Hummer, NP      . multivitamin with minerals tablet 1 tablet  1 tablet Oral Daily Cobos, Myer Peer, MD   1 tablet at 02/24/17 0757  . ondansetron (ZOFRAN-ODT) disintegrating tablet 4 mg  4 mg Oral Q6H PRN Cobos, Myer Peer, MD      . PARoxetine (PAXIL) tablet 10 mg  10 mg Oral QHS Cobos, Myer Peer, MD   10 mg at 02/23/17 2148  . thiamine (B-1) injection 100 mg  100 mg Intramuscular Once Cobos, Fernando A, MD      . thiamine (VITAMIN B-1) tablet 100 mg  100 mg Oral Daily Cobos, Myer Peer, MD   100 mg at 02/24/17 0757  . traZODone (DESYREL) tablet 50 mg  50 mg Oral QHS PRN Niel Hummer, NP   50 mg at 02/23/17 2148    Lab Results: No results found for this or any previous visit (from the past 48 hour(s)).  Blood Alcohol level:  Lab Results  Component Value Date   ETH 212 (H) 84/13/2440   Metabolic Disorder Labs: No results found for: HGBA1C, MPG No results found for: PROLACTIN Lab Results  Component Value Date   CHOL 158 08/08/2013   TRIG 66 08/08/2013   HDL 53 08/08/2013   CHOLHDL 3.0 08/08/2013   LDLCALC 92 08/08/2013    Physical Findings: AIMS: Facial and Oral Movements Muscles of Facial Expression: None, normal Lips and Perioral Area: None, normal Jaw: None, normal Tongue: None, normal,Extremity Movements Upper (arms, wrists, hands, fingers): None, normal Lower (legs, knees, ankles, toes): None, normal, Trunk Movements Neck, shoulders, hips: None, normal, Overall Severity Severity of abnormal movements (highest score from  questions above): None, normal Incapacitation due to abnormal movements: None, normal Patient's awareness of abnormal movements (rate only patient's report): No Awareness, Dental Status Current problems with teeth and/or dentures?: No Does patient usually wear dentures?: No  CIWA:  CIWA-Ar Total: 0 COWS:     Musculoskeletal: Strength & Muscle Tone: within normal limits no tremors, no diaphoresis, no acute distress or agitation  Gait & Station: normal Patient leans: N/A  Psychiatric Specialty Exam: Physical Exam  ROS denies headache, denies visual disturbances, no vomiting   Blood pressure 112/88, pulse 74, temperature  98.4 F (36.9 C), temperature source Oral, resp. rate 18, height 6' (1.829 m), weight 77.1 kg (170 lb), SpO2 99 %.Body mass index is 23.06 kg/m.  General Appearance: Well Groomed  Eye Contact:  Good  Speech:  Normal Rate  Volume:  Normal  Mood:  minimizes depression, describes mood as "OK"  Affect:  somewhat constricted, does smile briefly at times   Thought Process:  Linear and Descriptions of Associations: Intact  Orientation:  Other:  fully alert and attentive   Thought Content:  denies hallucinations, no delusions expressed   Suicidal Thoughts:  No denies suicidal or self injurious ideations, denies any homicidal ideations, specifically also denies any homicidal or violent ideations towards parents   Homicidal Thoughts:  No  Memory:  recent and remote grossly intact   Judgement:  Fair- improving   Insight:  improving  Psychomotor Activity:  Normal- not currently presenting with symptoms of acute alcohol withdrawal  Concentration:  Concentration: Good and Attention Span: Good  Recall:  Good  Fund of Knowledge:  Good  Language:  Good  Akathisia:  No  Handed:  Right  AIMS (if indicated):     Assets:  Communication Skills Desire for Improvement Physical Health Resilience  ADL's:  Intact  Cognition:  WNL  Sleep:  Number of Hours: 6.75   Assessment -  Patient not presenting with symptoms of alcohol WDL at this time, presents calm, comfortable, with stable vitals, and in no acute distress or discomfort. Minimizes depression at this time, but does present vaguely constricted in affect and describes " philosophical" questions about the purpose and meaning of life. Denies any actual suicidal ideations, and is future oriented . Denies medication side effects.   Treatment Plan Summary: Daily contact with patient to assess and evaluate symptoms and progress in treatment, Medication management, Plan inpatient treatment  and medications as below   Will continue today 02/24/17 plan as below except where it is noted.  Encourage group and milieu participation to work on Radiographer, therapeutic and symptom reduction  Continue Ativan detox protocol ( PRN) for potential symptoms of alcohol WDL Continue Paxil 10 mgrs QHS (prefers QHS dosing) to help address depression, anxiety symptoms Continue Trazodone 50 mgrs QHS PRN for insomnia as needed  Continue to encourage efforts to address alcohol use disorder, encourage abstinence Treatment team working on disposition planning options  Encarnacion Slates, NP, PMHNP, FNP-BC. 02/24/2017, 5:01 PMPatient ID: Juan Woods, male   DOB: 08-07-1991, 26 y.o.   MRN: [id number]

## 2017-02-24 NOTE — Plan of Care (Signed)
Problem: Education: Goal: Verbalization of understanding the information provided will improve Outcome: Progressing Patient verbalizes understanding of information, education provided.  Problem: Safety: Goal: Ability to remain free from injury will improve Outcome: Progressing Patient has not engaged in self harm, denies SI.

## 2017-02-24 NOTE — [facility] Group Notes (Signed)
[facility] LCSW Group Therapy 02/24/2017 1:15pm  Type of Therapy: Group Therapy- Balance in Life  Participation Level: Active   Description of the Group:  The topic for group was balance in life. Today's group focused on defining balance in one's own words, identifying things that can knock one off balance, and exploring healthy ways to maintain balance in life. Group members were asked to provide an example of a time when they felt off balance, describe how they handled that situation,and process healthier ways to regain balance in the future. Group members were asked to share the most important tool for maintaining [facility] and how they plan to apply this method after discharge.  Summary of Patient Progress Pt discussed his need to incorporate more structure into his life. He expressed that in order to create balance he would also like to have more time to engage in hobbies and leisure activities.     Therapeutic Modalities:   Cognitive Behavioral Therapy Solution-Focused Therapy Assertiveness Training   Juan ShanksLauren Yobana Culliton, LCSW 02/24/2017 7:08 PM

## 2017-02-24 NOTE — [facility] Group Notes (Signed)
[facility] Group Notes:  (Nursing/MHT/Case Management/Adjunct)  Date:  02/24/2017  Time:  0900  Type of Therapy:  Nurse Education  - Unit Orientation  Participation Level:  Active  Participation Quality:  Attentive  Affect:  Appropriate  Cognitive:  Alert  Insight:  Improving  Engagement in Group:  Engaged  Modes of Intervention:  Education  Summary of Progress/Problems: Patient oriented to unit, educated on unit expectations.    Merian CapronFriedman, Reighlynn Swiney [facility] CEakes 02/24/2017, 9:46 AM

## 2017-02-24 NOTE — Progress Notes (Signed)
Adult Psychoeducational Group Note  Date:  02/24/2017 Time:  2:13 AM  Group Topic/Focus:  Wrap-Up Group:   The focus of this group is to help patients review their daily goal of treatment and discuss progress on daily workbooks.  Participation Level:  Active  Participation Quality:  Appropriate, Attentive and Sharing  Affect:  Appropriate  Cognitive:  Appropriate  Insight: Appropriate and Good  Engagement in Group:  Developing/Improving  Modes of Intervention:  Discussion  Additional Comments:  Pt shared he had a great day. Pt stated she was able to get her medications straighten out. Pt stated he had a positive day.  Karleen HampshireFox, Mazzie Brodrick Brittini 02/24/2017, 2:13 AM

## 2017-02-24 NOTE — Progress Notes (Signed)
D: Spoke with patient 1:1 who is up and visible in the milieu. Patient's affect flat, depressed with congruent mood. Rating depression at a 0/10, hopelessness and anxiety at a 1/10. Rates sleep as good, appetite as good, energy as high and concentration as good.  States goal for today is to "prepare myself for getting out, focus on staying sober, stay in a productive and positive mind set." Denies pain, physical problems.   A: Medicated per orders, no prn meds given or requested. Emotional support offered and self inventory reviewed. Encouraged completion of Suicide Safety Plan. Discussed POC with MD, SW.    R: Patient verbalizes understanding of POC. Patient denies SI/HI and remains safe on level III obs.

## 2017-02-25 MED ORDER — PAROXETINE HCL 10 MG PO TABS: 10.0000 mg | ORAL_TABLET | Freq: Every day | ORAL | 0 refills | Status: DC

## 2017-02-25 MED ORDER — ACYCLOVIR 400 MG PO TABS: 400.0000 mg | ORAL_TABLET | Freq: Every day | ORAL | Status: DC

## 2017-02-25 MED ORDER — ALBUTEROL SULFATE HFA 108 (90 BASE) MCG/ACT IN AERS: 1.0000 | INHALATION_SPRAY | Freq: Four times a day (QID) | RESPIRATORY_TRACT | Status: DC | PRN

## 2017-02-25 MED ORDER — HYDROXYZINE HCL 25 MG PO TABS: ORAL_TABLET | ORAL | 0 refills | Status: DC

## 2017-02-25 MED ORDER — TRAZODONE HCL 50 MG PO TABS: 50.0000 mg | ORAL_TABLET | Freq: Every evening | ORAL | 0 refills | Status: AC | PRN

## 2017-02-25 NOTE — Tx Team (Signed)
Interdisciplinary Treatment and Diagnostic Plan Update  02/25/2017 Time of Session: 9:30am Juan Woods MRN: [id number]  Principal Diagnosis: Alcohol use disorder, moderate, dependence ([facility])  Secondary Diagnoses: Principal Problem:   Alcohol use disorder, moderate, dependence ([facility])   Current Medications:  Current Facility-Administered Medications  Medication Dose Route Frequency Provider Last Rate Last Dose  . acetaminophen (TYLENOL) tablet 650 mg  650 mg Oral Q6H PRN Fransisca Kaufmann A, NP      . acyclovir (ZOVIRAX) 200 MG capsule 400 mg  400 mg Oral QHS Cobos, Rockey Situ, MD   400 mg at 02/24/17 2213  . albuterol (PROVENTIL HFA;VENTOLIN HFA) 108 (90 Base) MCG/ACT inhaler 1-2 puff  1-2 puff Inhalation Q6H PRN Thermon Leyland, NP      . alum & mag hydroxide-simeth (MAALOX/MYLANTA) 200-200-20 MG/5ML suspension 30 mL  30 mL Oral Q4H PRN Thermon Leyland, NP      . hydrOXYzine (ATARAX/VISTARIL) tablet 25 mg  25 mg Oral Q6H PRN Cobos, Rockey Situ, MD      . loperamide (IMODIUM) capsule 2-4 mg  2-4 mg Oral PRN Cobos, Rockey Situ, MD      . LORazepam (ATIVAN) tablet 1 mg  1 mg Oral Q6H PRN Cobos, Rockey Situ, MD      . magnesium hydroxide (MILK OF MAGNESIA) suspension 30 mL  30 mL Oral Daily PRN Fransisca Kaufmann A, NP      . multivitamin with minerals tablet 1 tablet  1 tablet Oral Daily Cobos, Rockey Situ, MD   1 tablet at 02/25/17 1023  . ondansetron (ZOFRAN-ODT) disintegrating tablet 4 mg  4 mg Oral Q6H PRN Cobos, Rockey Situ, MD      . PARoxetine (PAXIL) tablet 10 mg  10 mg Oral QHS Cobos, Rockey Situ, MD   10 mg at 02/24/17 2213  . thiamine (B-1) injection 100 mg  100 mg Intramuscular Once Cobos, Fernando A, MD      . thiamine (VITAMIN B-1) tablet 100 mg  100 mg Oral Daily Cobos, Rockey Situ, MD   100 mg at 02/25/17 1022  . traZODone (DESYREL) tablet 50 mg  50 mg Oral QHS PRN Thermon Leyland, NP   50 mg at 02/24/17 2214    PTA Medications: Prescriptions Prior to Admission  Medication Sig Dispense Refill  Last Dose  . PARoxetine (PAXIL) 20 MG tablet Take 20 mg by mouth at bedtime.   Past Week at Unknown time  . traZODone (DESYREL) 50 MG tablet Take 25 mg by mouth at bedtime.   Past Week at Unknown time  . [DISCONTINUED] acyclovir (ZOVIRAX) 400 MG tablet Take 400 mg by mouth at bedtime.    Past Week at Unknown time  . [DISCONTINUED] albuterol (PROVENTIL HFA;VENTOLIN HFA) 108 (90 BASE) MCG/ACT inhaler Inhale 1-2 puffs into the lungs every 6 (six) hours as needed for wheezing or shortness of breath.    Past Month at Unknown time    Treatment Modalities: Medication Management, Group therapy, Case management,  1 to 1 session with clinician, Psychoeducation, Recreational therapy.  Patient Stressors: Financial difficulties Substance abuse  Patient Strengths: Wellsite geologist fund of knowledge Physical Health Supportive family/friends Work Firefighter for Primary Diagnosis: Alcohol use disorder, moderate, dependence ([facility]) Long Term Goal(s): Improvement in symptoms so as ready for discharge  Short Term Goals: Ability to identify changes in lifestyle to reduce recurrence of condition will improve Ability to verbalize feelings will improve Ability to disclose and discuss suicidal ideas Ability to demonstrate self-control  will improve Ability to identify and develop effective coping behaviors will improve Ability to maintain clinical measurements within normal limits will improve Compliance with prescribed medications will improve Ability to identify triggers associated with substance abuse/mental health issues will improve Ability to identify changes in lifestyle to reduce recurrence of condition will improve Ability to verbalize feelings will improve Ability to disclose and discuss suicidal ideas Ability to demonstrate self-control will improve Ability to identify and develop effective coping behaviors will improve Ability to maintain clinical measurements  within normal limits will improve Compliance with prescribed medications will improve Ability to identify triggers associated with substance abuse/mental health issues will improve  Medication Management: Evaluate patient's response, side effects, and tolerance of medication regimen.  Therapeutic Interventions: 1 to 1 sessions, Unit Group sessions and Medication administration.  Evaluation of Outcomes: Adequate for Discharge  Physician Treatment Plan for Secondary Diagnosis: Principal Problem:   Alcohol use disorder, moderate, dependence ([facility]CC)   Long Term Goal(s): Improvement in symptoms so as ready for discharge  Short Term Goals: Ability to identify changes in lifestyle to reduce recurrence of condition will improve Ability to verbalize feelings will improve Ability to disclose and discuss suicidal ideas Ability to demonstrate self-control will improve Ability to identify and develop effective coping behaviors will improve Ability to maintain clinical measurements within normal limits will improve Compliance with prescribed medications will improve Ability to identify triggers associated with substance abuse/mental health issues will improve Ability to identify changes in lifestyle to reduce recurrence of condition will improve Ability to verbalize feelings will improve Ability to disclose and discuss suicidal ideas Ability to demonstrate self-control will improve Ability to identify and develop effective coping behaviors will improve Ability to maintain clinical measurements within normal limits will improve Compliance with prescribed medications will improve Ability to identify triggers associated with substance abuse/mental health issues will improve  Medication Management: Evaluate patient's response, side effects, and tolerance of medication regimen.  Therapeutic Interventions: 1 to 1 sessions, Unit Group sessions and Medication administration.  Evaluation of Outcomes:  Adequate for Discharge   RN Treatment Plan for Primary Diagnosis: Alcohol use disorder, moderate, dependence ([facility]) Long Term Goal(s): Knowledge of disease and therapeutic regimen to maintain health will improve  Short Term Goals: Ability to verbalize feelings will improve, Ability to disclose and discuss suicidal ideas and Compliance with prescribed medications will improve  Medication Management: RN will administer medications as ordered by provider, will assess and evaluate patient's response and provide education to patient for prescribed medication. RN will report any adverse and/or side effects to prescribing provider.  Therapeutic Interventions: 1 on 1 counseling sessions, Psychoeducation, Medication administration, Evaluate responses to treatment, Monitor vital signs and CBGs as ordered, Perform/monitor CIWA, COWS, AIMS and Fall Risk screenings as ordered, Perform wound care treatments as ordered.  Evaluation of Outcomes: Adequate for Discharge   LCSW Treatment Plan for Primary Diagnosis: Alcohol use disorder, moderate, dependence ([facility]CC) Long Term Goal(s): Safe transition to appropriate next level of care at discharge, Engage patient in therapeutic group addressing interpersonal concerns.  Short Term Goals: Engage patient in aftercare planning with referrals and resources, Identify triggers associated with mental health/substance abuse issues and Increase skills for wellness and recovery  Therapeutic Interventions: Assess for all discharge needs, 1 to 1 time with Social worker, Explore available resources and support systems, Assess for adequacy in community support network, Educate family and significant other(s) on suicide prevention, Complete Psychosocial Assessment, Interpersonal group therapy.  Evaluation of Outcomes: Adequate for Discharge   Progress  in Treatment: Attending groups: Yes Participating in groups: Yes Taking medication as prescribed: Yes, Toleration medication:  Yes, no side effects reported at this time Family/Significant other contact made: yes, Mother  Patient understands diagnosis: No, limited insight  Discussing patient identified problems/goals with staff: Yes Medical problems stabilized or resolved: Yes Denies suicidal/homicidal ideation: Yes Issues/concerns per patient self-inventory: None Other: N/A  New problem(s) identified: None identified at this time.   New Short Term/Long Term Goal(s): None identified at this time.   Discharge Plan or Barriers: CSW will assess for appropriate discharge plan and relevant barriers.   Reason for Continuation of Hospitalization: Anxiety Depression Medication stabilization  Estimated Length of Stay: 3-5 days  Attendees: Patient: 02/25/2017  12:03 PM  Physician: Dr. Jama Flavors 02/25/2017  12:03 PM  Nursing: Marion Downer, RN 02/25/2017  12:03 PM  RN Care Manager: Onnie Boer, RN 02/25/2017  12:03 PM  Social Worker: Vernie Shanks, LCSW; Donnelly Stager, Yetta Numbers [place] LCSWA 02/25/2017  12:03 PM  Recreational Therapist:  02/25/2017  12:03 PM  Other: Armandina Stammer, NP; Gray Bernhardt, NP 02/25/2017  12:03 PM  Other:  02/25/2017  12:03 PM  Other: 02/25/2017  12:03 PM    Scribe for Treatment Team: Rondall Allegra MSW, LCSWA  02/25/2017 12:03 PM

## 2017-02-25 NOTE — Progress Notes (Signed)
Recreation Therapy Notes  Date: 02/25/17 Time: 0930 Location: [address] Dayroom  Group Topic: Stress Management  Goal Area(s) Addresses:  Patient will verbalize importance of using healthy stress management.  Patient will identify positive emotions associated with healthy stress management.   Behavioral Response: Engaged  Intervention: Stress Management  Activity :  Guided Imagery.  LRT introduced the stress management technique of guided imagery.  LRT read a script to guide patients and engage them in the technique.  Patients were to follow along as LRT read script to fully participate in the technique.  Education:  Stress Management, Discharge Planning.   Education Outcome: Acknowledges edcuation/In group clarification offered/Needs additional education  Clinical Observations/Feedback: Pt attended group.   Caroll RancherMarjette Elayjah Chaney, LRT/CTRS         Caroll RancherLindsay, Odilon Cass A 02/25/2017 12:08 PM

## 2017-02-25 NOTE — Discharge Summary (Signed)
Physician Discharge Summary Note  Patient:  Juan Woods is an 26 y.o., male MRN:  [id number] DOB:  1991-06-08 Patient phone:  [phone] (home)  Patient address:   [address] [place] [address],  Total Time spent with patient: Greater than 30 minutes  Date of Admission:  02/21/2017 Date of Discharge: 02-25-17  Reason for Admission: Suicidal/homicidal threats.  Principal Problem: Alcohol use disorder, moderate, dependence (HCC)  Discharge Diagnoses: Patient Active Problem List   Diagnosis Date Noted  . Alcohol use disorder, moderate, dependence (HCC) [F10.20] 02/21/2017   Past Psychiatric History: Alcoholism, chronic.  Past Medical History:  Past Medical History:  Diagnosis Date  . Asthma     Past Surgical History:  Procedure Laterality Date  . HERNIA REPAIR    . HIP SURGERY    . TESTICLE SURGERY    . WISDOM TOOTH EXTRACTION     Family History:  Family History  Problem Relation Age of Onset  . Fibromyalgia Mother   . Hypertension Mother   . Hypertension Father    Family Psychiatric  History: See H&P  Social History:  History  Alcohol Use  . Yes    Comment: occ     History  Drug Use No    Social History   Social History  . Marital status: Single    Spouse name: N/A  . Number of children: N/A  . Years of education: N/A   Social History Main Topics  . Smoking status: Never Smoker  . Smokeless tobacco: Former Neurosurgeon    Quit date: 02/07/2017  . Alcohol use Yes     Comment: occ  . Drug use: No  . Sexual activity: Yes    Birth control/ protection: None   Other Topics Concern  . None   Social History Narrative  . None   Hospital Course: Jefferson is a 26 y/o Caucasian male with hx of alcoholism, chronic. He was admitted to the [facility] with a BAL of 212 per toxicology test results. He admitted to drinking to feel good.  He has been prescribed Paxil for depression in the past and he is compliant on the medications.  He states that it is working. He was  in a detox program and was sober for 9 months.  He states that he relapsed back in April due to work related stress.  He does not recall threatening to end his life or does he recall threatening to hurt his parents.  He states that he does use cocaine 75 % time when drinking.  "I can drink more when I do cocaine at the same time."  He states binge drinking twice weekly, 12-18 beers at a time.   After evaluation of his presenting symptoms, it was noted that [name] was not presenting with any substance withdrawal symptoms, as a result, did not receive any detoxification treatment regimen. He was however, restarted on his Paxil 10 mg for depression. He was medicated & discharged on Trazodone 50 mg for insomnia. [name] was enrolled & participated in the group counseling sessions being offered & held on this unit. He learned coping skills.  Lorik's symptoms responded well to his treatment regimen. This is evidenced by his reports of improved mood & absence of substance withdrawal symptoms. He is seen today by his his attending psychiatrist. [name] stated that he is pleased that he sought help. Says he is tolerating his medications well. No withdrawal symptoms. No craving for alcohol. He is no longer feeling depressed. Describes normal energy  and ability to think. Able to focus on task. No suicidal thoughts. No homicidal thoughts. No thoughts of violence. Patient reports normal biological functions.   Nursing staff reports that [name] has been appropriate on the unit. Patient has been interacting well with peers & staff. No behavioral issues. Patient has not voiced any suicidal thoughts. Patient has not been observed to be internally stimulated or pre-occupied. Patient has been adherent with treatment recommendations. Patient has been tolerating their medication well, denies any adverse reactions or side effects.   Patient was discussed at team meeting this morning. Team members feels that patient is back to his  baseline level of function. Team agrees with plan to discharge patient today. Upon discharge, [name] adamantly denies any SIHI, AVH, delusional thoughts, paranoia or substance withdrawal symptoms. He will continue further psychiatric follow-up care/medication management on an outpatient basis as noted below. He was provided with all the necessary information needed to make this appointment without problems. [facility] in no apparent distress. Transportation per parents.  Physical Findings: AIMS: Facial and Oral Movements Muscles of Facial Expression: None, normal Lips and Perioral Area: None, normal Jaw: None, normal Tongue: None, normal,Extremity Movements Upper (arms, wrists, hands, fingers): None, normal Lower (legs, knees, ankles, toes): None, normal, Trunk Movements Neck, shoulders, hips: None, normal, Overall Severity Severity of abnormal movements (highest score from questions above): None, normal Incapacitation due to abnormal movements: None, normal Patient's awareness of abnormal movements (rate only patient's report): No Awareness, Dental Status Current problems with teeth and/or dentures?: No Does patient usually wear dentures?: No  CIWA:  CIWA-Ar Total: 0 COWS:     Musculoskeletal: Strength & Muscle Tone: within normal limits Gait & Station: normal Patient leans: N/A  Psychiatric Specialty Exam: Physical Exam  Constitutional: He is oriented to person, place, and time. He appears well-developed.  HENT:  Head: Normocephalic.  Eyes: Pupils are equal, round, and reactive to light.  Neck: Normal range of motion.  Cardiovascular: Normal rate.   Respiratory: Effort normal.  GI: Soft.  Genitourinary:  Genitourinary Comments: Deferred  Musculoskeletal: Normal range of motion.  Neurological: He is alert and oriented to person, place, and time.  Skin: Skin is warm.    Review of Systems  Constitutional: Negative.   HENT: Negative.   Eyes:  Negative.   Respiratory: Negative.   Cardiovascular: Negative.   Gastrointestinal: Negative.   Genitourinary: Negative.   Musculoskeletal: Negative.   Skin: Negative.   Neurological: Negative.   Endo/Heme/Allergies: Negative.   Psychiatric/Behavioral: Positive for depression (Stable) and substance abuse (Hx. Alcoholism, chronic). Negative for hallucinations, memory loss and suicidal ideas. The patient has insomnia (Stable). The patient is not nervous/anxious.     Blood pressure 112/74, pulse 77, temperature 97.9 F (36.6 C), temperature source Oral, resp. rate 16, height 6' (1.829 m), weight 77.1 kg (170 lb), SpO2 99 %.Body mass index is 23.06 kg/m.  See Md's SRA   Have you used any form of tobacco in the last 30 days? (Cigarettes, Smokeless Tobacco, Cigars, and/or Pipes): Yes  Has this patient used any form of tobacco in the last 30 days? (Cigarettes, Smokeless Tobacco, Cigars, and/or Pipes): No  Blood Alcohol level:  Lab Results  Component Value Date   ETH 212 (H) 02/19/2017   Metabolic Disorder Labs:  No results found for: HGBA1C, MPG No results found for: PROLACTIN Lab Results  Component Value Date   CHOL 158 08/08/2013   TRIG 66 08/08/2013   HDL 53 08/08/2013  CHOLHDL 3.0 08/08/2013   LDLCALC 92 08/08/2013   See Psychiatric Specialty Exam and Suicide Risk Assessment completed by Attending Physician prior to discharge.  Discharge destination:  Home  Is patient on multiple antipsychotic therapies at discharge:  No   Has Patient had three or more failed trials of antipsychotic monotherapy by history:  No  Recommended Plan for Multiple Antipsychotic Therapies: NA  Allergies as of 02/25/2017      Reactions   Penicillins Other (See Comments)   Reaction unknown Has patient had a PCN reaction causing immediate rash, facial/tongue/throat swelling, SOB or lightheadedness with hypotension: unknown Has patient had a PCN reaction causing severe rash involving mucus membranes  or skin necrosis: unknown Has patient had a PCN reaction that required hospitalization unknown Has patient had a PCN reaction occurring within the last 10 years: unknown If all of the above answers are "NO", then may proceed with Cephalosporin use.      Medication List    TAKE these medications     Indication  acyclovir 400 MG tablet Commonly known as:  ZOVIRAX Take 1 tablet (400 mg total) by mouth at bedtime. For Herpes infection What changed:  additional instructions  Indication:  Genital Herpes   albuterol 108 (90 Base) MCG/ACT inhaler Commonly known as:  PROVENTIL HFA;VENTOLIN HFA Inhale 1-2 puffs into the lungs every 6 (six) hours as needed for wheezing or shortness of breath.  Indication:  Asthma   hydrOXYzine 25 MG tablet Commonly known as:  ATARAX/VISTARIL Take 1 tablet (25 mg) Q 6 hours prn: For anxiety  Indication:  Anxiety Neurosis   PARoxetine 10 MG tablet Commonly known as:  PAXIL Take 1 tablet (10 mg total) by mouth at bedtime. For depression What changed:  medication strength  how much to take  additional instructions  Indication:  Major Depressive Disorder   traZODone 50 MG tablet Commonly known as:  DESYREL Take 1 tablet (50 mg total) by mouth at bedtime as needed for sleep. What changed:  how much to take  when to take this  reasons to take this  Indication:  Trouble Sleeping      Follow-up Information    [facility] Follow up on 03/01/2017.   Specialty:  Behavioral Health Why:  at 4:00pm for your initial assessment with Dr. Cheyenne Adas.  Contact information: [address] [place] [address] [phone]          Follow-up recommendations: Activity:  As tolerated Diet: As recommended by your primary care doctor. Keep all scheduled follow-up appointments as recommended.   Comments: Patient is instructed prior to discharge to: Take all medications as prescribed by his/her mental healthcare provider. Report any adverse  effects and or reactions from the medicines to his/her outpatient provider promptly. Patient has been instructed & cautioned: To not engage in alcohol and or illegal drug use while on prescription medicines. In the event of worsening symptoms, patient is instructed to call the crisis hotline, 911 and or go to the nearest ED for appropriate evaluation and treatment of symptoms. To follow-up with his/her primary care provider for your other medical issues, concerns and or health care needs.   Signed: Sanjuana Kava, NP, PMHNP, FNO-BC 02/25/2017, 10:08 AM   Patient seen, Suicide Assessment Completed.  Disposition Plan Reviewed

## 2017-02-25 NOTE — Progress Notes (Signed)
Data. Patient denies SI/HI/AVH. Patient interacting well with staff and other patients. Affect is bright and conversation is appropriate. Patient reports, "I am looking forward to leaving today." Action. Emotional support and encouragement offered. Education provided on medication, indications and side effect. Q 15 minute checks done for safety. Response. Safety on the unit maintained through 15 minute checks.  Medications taken as prescribed. Attended groups. Remained calm and appropriate through out shift.  Pt. discharged to lobby. Belongings sheet reviewed and signed by pt. and all belongings, including scripts, sent home. Paperwork reviewed and pt. able to verbalize understanding of education. Pt. in no current distress and ambulatory.

## 2017-02-25 NOTE — Progress Notes (Signed)
Pt attended karaoke group and participated. 

## 2017-02-25 NOTE — Progress Notes (Signed)
  [facility] Adult [facility] Discharge Plan :  Will you be returning to the same living situation after discharge:  Yes,  home At discharge, do you have transportation home?: Yes,  parent s Do you have the ability to pay for your medications: Yes,  insuranc e  Release of information consent forms completed and in the chart;  Patient's signature needed at discharge.  Patient to Follow up at: Follow-up Information    [facility] Follow up on 03/01/2017.   Specialty:  Behavioral Health Why:  at 4:00pm for your initial assessment with Dr. Cheyenne Adasinger.  Contact information: [address] [place] [address] [phone]           Next level of care provider has access to [facility] Link:no  Safety Planning and Suicide Prevention discussed: Yes,  with patient and mother   Have you used any form of tobacco in the last 30 days? (Cigarettes, Smokeless Tobacco, Cigars, and/or Pipes): Yes  Has patient been referred to the Quitline?: Patient refused referral  Patient has been referred for addiction treatment: Yes  [name] MSW, LCSWA  02/25/2017, 11:41 AM

## 2017-02-25 NOTE — [facility] Suicide Risk Assessment (Addendum)
[facility]   Principal Problem: Alcohol use disorder, moderate, dependence (HCC) Discharge Diagnoses:  Patient Active Problem List   Diagnosis Date Noted  . Alcohol use disorder, moderate, dependence (HCC) [F10.20] 02/21/2017    Total Time spent with patient: 30 minutes  Musculoskeletal: Strength & Muscle Tone: within normal limits Gait & Station: normal Patient leans: N/A  Psychiatric Specialty Exam: ROS no headache, no chest pain, no shortness of breath, no dyspnea  Blood pressure 112/74, pulse 77, temperature 97.9 F (36.6 C), temperature source Oral, resp. rate 16, height 6' (1.829 m), weight 77.1 kg (170 lb), SpO2 99 %.Body mass index is 23.06 kg/m.  General Appearance: Well Groomed  Eye Contact::  Good  Speech:  Normal Rate409  Volume:  Normal  Mood:  mood is improved, denies feeling depressed   Affect:  Appropriate and reactive   Thought Process:  Linear and Descriptions of Associations: Intact  Orientation:  Full (Time, Place, and Person)  Thought Content:  denies hallucinations, no delusions, not internally preoccupied   Suicidal Thoughts:  No denies any suicidal or self injurious ideations   Homicidal Thoughts:  No denies any homicidal or violent ideations   Memory:  recent and remote grossly intact   Judgement:  Other:  improving  Insight:  improving   Psychomotor Activity:  Normal  Concentration:  Good  Recall:  Good  Fund of Knowledge:Good  Language: Good  Akathisia:  Negative  Handed:  Right  AIMS (if indicated):     Assets:  Communication Skills Desire for Improvement Physical Health Resilience  Sleep:  Number of Hours: 6.5  Cognition: WNL  ADL's:  Intact   Mental Status Per Nursing Assessment::   On Admission:  Suicidal ideation indicated by patient  Demographic Factors:  26 year old single male, lives with parents, employed   Loss Factors: Work related stress  Historical Factors: History of alcohol abuse ,  history of cocaine abuse, [facility]. No history of suicide attempts   Risk Reduction Factors:   Sense of responsibility to family, Employed, Living with another person, especially a relative and Positive coping skills or problem solving skills  Continued Clinical Symptoms:  Alert and attentive , well related, pleasant, mood improved, denies feeling depressed at this time, affect appropriate, no thought disorder, no suicidal ideations, no self injurious ideations, no hallucinations, no delusions, future oriented . No ongoing symptoms of alcohol withdrawal . Denies any cravings at this time and reports feeling motivated in sobriety, abstinence . Denies medication side effects.  Behavior on unit calm and in good control. He is leaving unit in good spirits .   Cognitive Features That Contribute To Risk:  No gross cognitive deficits noted upon discharge. Is alert , attentive, and oriented x 3   Suicide Risk:  Mild:  Suicidal ideation of limited frequency, intensity, duration, and specificity.  There are no identifiable plans, no associated intent, mild dysphoria and related symptoms, good self-control (both objective and subjective assessment), few other risk factors, and identifiable protective factors, including available and accessible social support.  Follow-up Information    [facility] Follow up on 03/01/2017.   Specialty:  Behavioral Health Why:  at 4:00pm for your initial assessment with Dr. Cheyenne Adasinger.  Contact information: [address] [place] [address] [phone]           Plan Of Care/Follow-up recommendations:  Activity:  as tolerated Diet:  regular Tests:  NA Other:  see below  Patient  is requesting discharge and there are no current grounds for involuntary commitment Leaving unit in good spirits  Plans to return home Encouraged to maintain full abstinence from alcohol and illicit drugs . Patient states he plans to go to [employer].  Craige Cotta, MD 02/25/2017, 1:51 PM

## 2017-03-11 ENCOUNTER — Other Ambulatory Visit (HOSPITAL_COMMUNITY): Payer: Self-pay | Admitting: Psychiatry

## 2017-03-27 ENCOUNTER — Other Ambulatory Visit (HOSPITAL_COMMUNITY): Payer: Self-pay | Admitting: Psychiatry

## 2017-12-21 ENCOUNTER — Other Ambulatory Visit (HOSPITAL_COMMUNITY): Payer: Self-pay | Admitting: Psychiatry

## 2018-10-18 ENCOUNTER — Encounter: Payer: Self-pay | Admitting: Allergy & Immunology

## 2018-10-18 ENCOUNTER — Ambulatory Visit: Payer: PRIVATE HEALTH INSURANCE | Admitting: Allergy & Immunology

## 2018-10-18 VITALS — BP 124/82 | HR 91 | Temp 98.2°F | Resp 16 | Ht 72.0 in | Wt 190.0 lb

## 2018-10-18 DIAGNOSIS — J310 Chronic rhinitis: Secondary | ICD-10-CM | POA: Diagnosis not present

## 2018-10-18 DIAGNOSIS — L2089 Other atopic dermatitis: Secondary | ICD-10-CM | POA: Diagnosis not present

## 2018-10-18 MED ORDER — TRIAMCINOLONE ACETONIDE 0.1 % EX OINT: 1.0000 "application " | TOPICAL_OINTMENT | Freq: Two times a day (BID) | CUTANEOUS | 2 refills | Status: DC | PRN

## 2018-10-18 NOTE — Patient Instructions (Addendum)
1. Chronic rhinitis - We will get some blood work to check for environmental allergies. - We will call you in 1-2 weeks with the results of the testing. - Stop taking: Claritin (loratadine) - Continue with: Flonase (fluticasone) two sprays per nostril daily - Start taking: Zyrtec (cetirizine) 10mg  tablet once daily and Astelin (azelastine) 2 sprays per nostril 1-2 times daily as needed - You can use an extra dose of the antihistamine, if needed, for breakthrough symptoms.  - Consider nasal saline rinses 1-2 times daily to remove allergens from the nasal cavities as well as help with mucous clearance (this is especially helpful to do before the nasal sprays are given) - Consider allergy shots as a means of long-term control. - Allergy shots "re-train" and "reset" the immune system to ignore environmental allergens and decrease the resulting immune response to those allergens (sneezing, itchy watery eyes, runny nose, nasal congestion, etc).    - Allergy shots improve symptoms in 75-85% of patients.  - We can discuss more at the next appointment if the medications are not working for you.  2. Flexural atopic dermatitis - Be sure to moisturize every day.  - Add on Eucrisa twice daily (samples provided - we will not send it in since it might be covered). - Add on triamcinolone ointment twice daily as needed (do NOT use on the face).   3. Return in about 3 months (around 01/17/2019).   Please inform us of any Emergency Department visits, hospitalizations, or changes in symptoms. Call us before going to the ED for breathing or allergy symptoms since we might be able to fit you in for a sick visit. Feel free to contact us anytime with any questions, problems, or concerns.  It was a pleasure to meet you today!  Websites that have reliable patient information: 1. American Academy of Asthma, Allergy, and Immunology: www.aaaai.org 2. Food Allergy Research and Education (FARE): foodallergy.org 3.  Mothers of Asthmatics: http://www.asthmacommunitynetwork.org 4. American College of Allergy, Asthma, and Immunology: MissingWeapons.ca   Make sure you are registered to vote! If you have moved or changed any of your contact information, you will need to get this updated before voting!    Voter ID laws are going into effect for the General Election in November 2020! Be prepared! Check out [employer]aping[employer].dk for more details.       ECZEMA SKIN CARE REGIMEN:  Bathed and soak for 10 minutes in warm water once today. Pat dry.  Immediately apply the below creams:  To healthy skin apply Aquaphor, Eucerin, Vanicream, Cerave, or Vaseline jelly twice a day.    To affected areas on the face and neck, apply: Eucrisa 2% ointment twice daily as needed. Be careful to avoid the eyes.   To affected areas on the body (below the face and neck), apply: Triamcinolone 0.1 % ointment twice a day as needed. With ointments, be careful to avoid the armpits and groin area.  Control itching with cetirizine (Zyrtec) daily. You can get generic cetirizine at any drug store or on [employer].com for much cheaper than the brand name.   Note of any foods make the eczema worse.  Keep finger nails trimmed and filed.  Use soaps and shampoos that are unscented and have the fewest amounts of additives. Some good examples include:

## 2018-10-18 NOTE — Progress Notes (Signed)
NEW PATIENT  Date of Service/Encounter:  10/18/18  Referring provider: Selinda FlavinHoward, Kevin, MD   Assessment:   Chronic rhinitis   Atopic dermatitis  Plan/Recommendations:   1. Chronic rhinitis - We will get some blood work to check for environmental allergies. - We will call you in 1-2 weeks with the results of the testing. - Stop taking: Claritin (loratadine) - Continue with: Flonase (fluticasone) two sprays per nostril daily - Start taking: Zyrtec (cetirizine) 10mg  tablet once daily and Astelin (azelastine) 2 sprays per nostril 1-2 times daily as needed - You can use an extra dose of the antihistamine, if needed, for breakthrough symptoms.  - Consider nasal saline rinses 1-2 times daily to remove allergens from the nasal cavities as well as help with mucous clearance (this is especially helpful to do before the nasal sprays are given) - Consider allergy shots as a means of long-term control. - Allergy shots "re-train" and "reset" the immune system to ignore environmental allergens and decrease the resulting immune response to those allergens (sneezing, itchy watery eyes, runny nose, nasal congestion, etc).    - Allergy shots improve symptoms in 75-85% of patients.  - We can discuss more at the next appointment if the medications are not working for you.  2. Flexural atopic dermatitis - Be sure to moisturize every day.  - Add on Eucrisa twice daily (samples provided - we will not send it in since it might be covered). - Add on triamcinolone ointment twice daily as needed (do NOT use on the face).   3. Return in about 3 months (around 01/17/2019).  Subjective:   Juan Woods is a 28 y.o. male presenting today for evaluation of  Chief Complaint  Patient presents with  . Nasal Congestion    Juan LoyalJames Ray Hartwell Woods has a history of the following: Patient Active Problem List   Diagnosis Date Noted  . Alcohol use disorder, moderate, dependence (HCC) 02/21/2017    History  obtained from: chart review and patient.  Juan LoyalJames Ray Biswell Woods was referred by Selinda FlavinHoward, Kevin, MD.     Juan Woods is a 28 y.o. male presenting for an evaluation of allergies.    He reports that he has been having more sinus infections over the last two years; he estimates that he has had three in 2019 within a few months. He works as a Psychologist, occupationalabricator (metal fab work). He is around a lot of dust and other chemicals. He has a lot of problems in the of fall. He has never been allergy tested. He [place] up [place]. He has four dogs, which he has had for around 8-9 years. He has no problems with cats.   He has been on loratadine for a year or so without improvement. He has not tried any other antihistamines to his knowledge. He has been on fluticasone which he seems to do fairly well with. He has not used it in quite some time.   He only ever gets the sinus infections but has no other problems with infections. He did have asthma as a child - very minor - but does not use a daily medication. He does have an inhaler but he has not used it in so long and he has no idea where it is. He does have problems with throat swelling from diet drinks (itchiness). This always seems to be the case with aspartame. He odes not have an EpiPen at all. He first noticed this problem around six years ago.  He does have eczema on his hands and elbows. This does tend to get worse when he is at work. He does use a moisturizer fairly regularly. He does use his girlfriend's topical steroid, but he does not know the name of it. He does use some kind of lotion with coconut oil.   He is on Lexapro for depression. This has been well controlled for quite some time.  Otherwise, there is no history of other atopic diseases, including food allergies, drug allergies, stinging insect allergies, eczema or urticaria. There is no significant infectious history. Vaccinations are up to date.    Past Medical History: Patient Active Problem List    Diagnosis Date Noted  . Alcohol use disorder, moderate, dependence (HCC) 02/21/2017    Medication List:  Allergies as of 10/18/2018   No Known Allergies     Medication List       Accurate as of October 18, 2018  4:07 PM. Always use your most recent med list.        acyclovir 400 MG tablet Commonly known as:  ZOVIRAX Take 1 tablet (400 mg total) by mouth at bedtime. For Herpes infection   albuterol 108 (90 Base) MCG/ACT inhaler Commonly known as:  PROVENTIL HFA;VENTOLIN HFA Inhale 1-2 puffs into the lungs every 6 (six) hours as needed for wheezing or shortness of breath.   escitalopram 20 MG tablet Commonly known as:  LEXAPRO Take by mouth.   hydrOXYzine 25 MG tablet Commonly known as:  ATARAX/VISTARIL Take 1 tablet (25 mg) Q 6 hours prn: For anxiety   traZODone 50 MG tablet Commonly known as:  DESYREL Take 1 tablet (50 mg total) by mouth at bedtime as needed for sleep.   triamcinolone ointment 0.1 % Commonly known as:  KENALOG Apply 1 application topically 2 (two) times daily as needed. Do Not Use On Face       Birth History: non-contributory  Developmental History: non-contributory.   Past Surgical History: Past Surgical History:  Procedure Laterality Date  . HERNIA REPAIR    . HIP SURGERY    . TESTICLE SURGERY    . WISDOM TOOTH EXTRACTION       Family History: Family History  Problem Relation Age of Onset  . Fibromyalgia Mother   . Hypertension Mother   . Hypertension Father      Social History: Juan Woods lives at home with his parents and the four dogs.  He lives in a house that is 67 years old.  There is carpeting and hardwoods in the main living areas and hardwoods in the bedrooms.  They have electric heating and central cooling with some window units.  There are no dust mite covers.  He is exposed to smoke at his grandfather's house.  He is not a smoker, but uses nicotine pouches.    Review of Systems: a 14-point review of systems is pertinent for  what is mentioned in HPI.  Otherwise, all other systems were negative. Constitutional: negative other than that listed in the HPI Eyes: negative other than that listed in the HPI Ears, nose, mouth, throat, and face: negative other than that listed in the HPI Respiratory: negative other than that listed in the HPI Cardiovascular: negative other than that listed in the HPI Gastrointestinal: negative other than that listed in the HPI Genitourinary: negative other than that listed in the HPI Integument: negative other than that listed in the HPI Hematologic: negative other than that listed in the HPI Musculoskeletal: negative other than that listed  in the HPI Neurological: negative other than that listed in the HPI Allergy/Immunologic: negative other than that listed in the HPI    Objective:   Blood pressure 124/82, pulse 91, temperature 98.2 F (36.8 C), temperature source Oral, resp. rate 16, height 6' (1.829 m), weight 190 lb (86.2 kg), SpO2 97 %. Body mass index is 25.77 kg/m.   Physical Exam:  General: Alert, interactive, in no acute distress. Talkative. Eyes: No conjunctival injection bilaterally, no discharge on the right, no discharge on the left and no Horner-Trantas dots present. PERRL bilaterally. EOMI without pain. No photophobia.  Ears: Right TM pearly gray with normal light reflex, Left TM pearly gray with normal light reflex, Right TM intact without perforation and Left TM intact without perforation.  Nose/Throat: External nose within normal limits and septum midline. Turbinates edematous with clear discharge. Posterior oropharynx erythematous without cobblestoning in the posterior oropharynx. Tonsils 2+ without exudates.  Tongue without thrush and Geographic tongue present. Neck: Supple without thyromegaly. Trachea midline. Adenopathy: no enlarged lymph nodes appreciated in the anterior cervical, occipital, axillary, epitrochlear, inguinal, or popliteal  regions. Lungs: Clear to auscultation without wheezing, rhonchi or rales. No increased work of breathing. CV: Normal S1/S2. No murmurs. Capillary refill <2 seconds.  Abdomen: Nondistended, nontender. No guarding or rebound tenderness. Bowel sounds present in all fields and hypoactive  Skin: Dry, erythematous, excoriated patches on the bilateral antecubital fossae as well as the bilateral extensor surfaces of the hands and the wrists. Extremities:  No clubbing, cyanosis or edema. Neuro:   Grossly intact. No focal deficits appreciated. Responsive to questions.  Diagnostic studies: none    Malachi Bonds, MD [facility]

## 2018-10-20 LAB — IGE+ALLERGENS ZONE 2(30)
AMER SYCAMORE IGE QN: 0.12 kU/L — AB
Alternaria Alternata IgE: <0.1 kU/L
Aspergillus Fumigatus IgE: <0.1 kU/L
Bermuda Grass IgE: <0.1 kU/L
Cat Dander IgE: <0.1 kU/L
Cedar, Mountain IgE: 0.12 kU/L — AB
Cockroach, American IgE: <0.1 kU/L
D Farinae IgE: <0.1 kU/L
Dog Dander IgE: <0.1 kU/L
G017-IGE BAHIA GRASS: 0.13 kU/L — AB
Hickory, White IgE: 0.64 kU/L — AB
IGE (IMMUNOGLOBULIN E), SERUM: 15 [IU]/mL (ref 6–495)
MUGWORT IGE QN: 0.14 kU/L — AB
Maple/Box Elder IgE: 0.12 kU/L — AB
Mucor Racemosus IgE: <0.1 kU/L
Nettle IgE: <0.1 kU/L
Penicillium Chrysogen IgE: <0.1 kU/L
Plantain, English IgE: 0.13 kU/L — AB
Sheep Sorrel IgE Qn: <0.1 kU/L
Sweet gum IgE RAST Ql: 0.19 kU/L — AB
T003-IGE COMMON SILVER BIRCH: 0.39 kU/L — AB
T007-IGE OAK, WHITE: 0.41 kU/L — AB
T008-IGE ELM, AMERICAN: 0.2 kU/L — AB
TIMOTHY IGE: 0.22 kU/L — AB
W001-IGE RAGWEED, SHORT: 2.1 kU/L — AB
White Mulberry IgE: <0.1 kU/L

## 2019-01-19 ENCOUNTER — Ambulatory Visit: Payer: Self-pay | Admitting: Allergy & Immunology

## 2019-01-31 ENCOUNTER — Ambulatory Visit: Payer: Self-pay | Admitting: Allergy & Immunology

## 2019-04-25 ENCOUNTER — Emergency Department (HOSPITAL_COMMUNITY)
Admission: EM | Admit: 2019-04-25 | Discharge: 2019-04-25 | Disposition: A | Discharge status: Home / Self Care | Payer: No Typology Code available for payment source | Attending: Emergency Medicine | Admitting: Emergency Medicine

## 2019-04-25 ENCOUNTER — Other Ambulatory Visit: Payer: Self-pay

## 2019-04-25 ENCOUNTER — Emergency Department (HOSPITAL_COMMUNITY): Payer: No Typology Code available for payment source

## 2019-04-25 ENCOUNTER — Encounter (HOSPITAL_COMMUNITY): Payer: Self-pay

## 2019-04-25 DIAGNOSIS — S92354A Nondisplaced fracture of fifth metatarsal bone, right foot, initial encounter for closed fracture: Secondary | ICD-10-CM | POA: Diagnosis not present

## 2019-04-25 DIAGNOSIS — R030 Elevated blood-pressure reading, without diagnosis of hypertension: Secondary | ICD-10-CM | POA: Insufficient documentation

## 2019-04-25 DIAGNOSIS — W109XXA Fall (on) (from) unspecified stairs and steps, initial encounter: Secondary | ICD-10-CM | POA: Diagnosis not present

## 2019-04-25 DIAGNOSIS — Y939 Activity, unspecified: Secondary | ICD-10-CM | POA: Diagnosis not present

## 2019-04-25 DIAGNOSIS — Y999 Unspecified external cause status: Secondary | ICD-10-CM | POA: Insufficient documentation

## 2019-04-25 DIAGNOSIS — Y929 Unspecified place or not applicable: Secondary | ICD-10-CM | POA: Insufficient documentation

## 2019-04-25 DIAGNOSIS — S99921A Unspecified injury of right foot, initial encounter: Secondary | ICD-10-CM | POA: Diagnosis present

## 2019-04-25 NOTE — Discharge Instructions (Signed)
Follow-up with orthopedics for reevaluation.  I would suggest Tylenol, ibuprofen, ice and elevation of the extremity.

## 2019-04-25 NOTE — ED Triage Notes (Signed)
Pt reports that he fell down steps approx 12pm. Injured right foot. Noted to have hematoma to right side of foot

## 2019-04-25 NOTE — ED Provider Notes (Signed)
[facility]ERGENCY DEPARTMENT Provider Note   CSN: [id number] Arrival date & time: 04/25/19  1312  History   Chief Complaint Chief Complaint  Patient presents with  . Foot Pain   HPI Juan Woods is a 28 y.o. male with past medical history significant for asthma, depression who presents for evaluation after mechanical fall.  Patient states Juan Woods was walking and missed the last 2 steps on his staircase.  Patient states Juan Woods felt a pop to his midfoot after the injury.  Juan Woods denies hitting his head or LOC.  Juan Woods denies any coagulation or any additional pain.  Patient with mild swelling and tenderness to his right lateral midfoot.  Not take anything for his pain.  Juan Woods rates his current pain a 2/10.  Juan Woods was able to ambulate without difficulty prior to arrival.  Denies fever, chills, nausea, vomiting, chest pain, shortness of breath, headache, weakness, back pain, decreased range of motion his extremities, redness, warmth, numbness or tingling to his extremities.  Denies additional aggravating or alleviating factors.  History obtained from patient and past medical records.  No interpreter is used.     HPI  Past Medical History:  Diagnosis Date  . Asthma   . Depression     Patient Active Problem List   Diagnosis Date Noted  . Alcohol use disorder, moderate, dependence (HCC) 02/21/2017    Past Surgical History:  Procedure Laterality Date  . HERNIA REPAIR    . HIP SURGERY    . TESTICLE SURGERY    . WISDOM TOOTH EXTRACTION          Home Medications    Prior to Admission medications   Medication Sig Start Date End Date Taking? Authorizing Provider  acyclovir (ZOVIRAX) 400 MG tablet Take 1 tablet (400 mg total) by mouth at bedtime. For Herpes infection 02/25/17   Armandina StammerNwoko, Agnes I, NP  albuterol (PROVENTIL HFA;VENTOLIN HFA) 108 (90 Base) MCG/ACT inhaler Inhale 1-2 puffs into the lungs every 6 (six) hours as needed for wheezing or shortness of breath. Patient not taking: Reported on  10/18/2018 02/25/17   Armandina StammerNwoko, Agnes I, NP  escitalopram (LEXAPRO) 20 MG tablet Take by mouth.    [provider]  hydrOXYzine (ATARAX/VISTARIL) 25 MG tablet Take 1 tablet (25 mg) Q 6 hours prn: For anxiety Patient not taking: Reported on 10/18/2018 02/25/17   Armandina StammerNwoko, Agnes I, NP  traZODone (DESYREL) 50 MG tablet Take 1 tablet (50 mg total) by mouth at bedtime as needed for sleep. 02/25/17   Armandina StammerNwoko, Agnes I, NP  triamcinolone ointment (KENALOG) 0.1 % Apply 1 application topically 2 (two) times daily as needed. Do Not Use On Face 10/18/18   Alfonse SpruceGallagher, Joel Louis, MD    Family History Family History  Problem Relation Age of Onset  . Fibromyalgia Mother   . Hypertension Mother   . Hypertension Father     Social History Social History   Tobacco Use  . Smoking status: Never Smoker  . Smokeless tobacco: Former Engineer, waterUser  Substance Use Topics  . Alcohol use: Yes    Comment: occ  . Drug use: No     Allergies   Patient has no known allergies.   Review of Systems Review of Systems  Constitutional: Negative.   HENT: Negative.   Respiratory: Negative.   Gastrointestinal: Negative.   Genitourinary: Negative.   Musculoskeletal: Negative for back pain and gait problem.       Right foot pain  Skin: Negative.   Neurological: Negative.  All other systems reviewed and are negative.    Physical Exam Updated Vital Signs BP (!) 158/87 (BP Location: Right Arm)   Pulse 82   Temp 98.2 F (36.8 C) (Oral)   Resp 18   Ht 6' (1.829 m)   Wt 88.5 kg   SpO2 99%   BMI 26.45 kg/m   Physical Exam Vitals signs and nursing note reviewed.  Constitutional:      General: Juan Woods is not in acute distress.    Appearance: Juan Woods is well-developed. Juan Woods is not ill-appearing, toxic-appearing or diaphoretic.  HENT:     Head: Normocephalic and atraumatic.     Nose: Nose normal.     Mouth/Throat:     Mouth: Mucous membranes are moist.     Pharynx: Oropharynx is clear.  Eyes:     Pupils: Pupils are equal,  round, and reactive to light.  Neck:     Musculoskeletal: Normal range of motion and neck supple.  Cardiovascular:     Rate and Rhythm: Normal rate and regular rhythm.  Pulmonary:     Effort: Pulmonary effort is normal. No respiratory distress.  Abdominal:     General: There is no distension.     Palpations: Abdomen is soft.  Musculoskeletal: Normal range of motion.     Comments: Tenderness palpation over base of fifth metatarsal to right lower extremity.  Mild soft tissue swelling.  No obvious crepitus or deformity.  Juan Woods has no tenderness to his navicular, calcaneus, tibia/fibula.  Pelvis and bilateral hips stable to palpation.  No shortening or rotation of legs.  Juan Woods is ambulatory without difficulty.  Lower extremity compartments are soft.  Skin:    General: Skin is warm and dry.     Comments: No erythema, warmth, contusions or abrasions.  Brisk capillary refill.  Neurological:     Mental Status: Juan Woods is alert.     Comments: 5/5 strength bilateral lower extremities without difficulty.  Ambulatory without difficulty.      ED Treatments / Results  Labs (all labs ordered are listed, but only abnormal results are displayed) Labs Reviewed - No data to display  EKG None  Radiology Dg Foot Complete Right  Result Date: 04/25/2019 CLINICAL DATA:  Right foot pain status post fall EXAM: RIGHT FOOT COMPLETE - 3+ VIEW COMPARISON:  None. FINDINGS: Acute nondisplaced fracture of the base of the fifth metatarsal. No other fracture or dislocation. No aggressive osseous lesion. IMPRESSION: Acute nondisplaced fracture of the base of the fifth metatarsal. Electronically Signed   By: Kathreen Devoid   On: 04/25/2019 15:04    Procedures Procedures (including critical care time)  Medications Ordered in ED Medications - No data to display  Initial Impression / Assessment and Plan / ED Course  I have reviewed the triage vital signs and the nursing notes.  Pertinent labs & imaging results that were  available during my care of the patient were reviewed by me and considered in my medical decision making (see chart for details).  28 year old male appears otherwise well presents for evaluation after mechanical fall.  Juan Woods is afebrile, nonseptic, non-ill-appearing.  Patient with mechanical fall after missing the last 2 steps on a staircase.  Felt immediate pain to the base of his fifth metatarsal on his right lower extremity.  Juan Woods was ambulatory after the incident however has developed pain since.  Juan Woods has tenderness over the base of his fifth metatarsal on his right lower extremity.  No tenderness to tibia/fibula, hips or lower back.  Normal  musculoskeletal exam.  Neurovascularly intact.  Juan Woods is ambulatory with mild pain to his right lower extremity.  Compartments are soft.  No erythema or warmth.  No evidence of infectious process.  No additional injuries.  Juan Woods not hit his head or lose LOC.  Plain film x-ray with nondisplaced fracture to the base of his fifth metatarsal.  Will place in a cam walker.  Patient ready has crutches.  Discussed RICE for symptomatic management.  No evidence of open fracture.  Patient follow-up with orthopedics for reevaluation.  Discussed strict return precautions.  Patient voiced understanding agreeable to follow-up.  Did have elevated blood pressure in the emergency department Juan Woods is asymptomatic without headache, nausea, vomiting, chest pain, shortness of breath or abdominal pain.  I have low suspicion for hypertensive urgency or emergency.  Discussed follow-up with PCP for reevaluation.  The patient has been appropriately medically screened and/or stabilized in the ED. I have low suspicion for any other emergent medical condition which would require further screening, evaluation or treatment in the ED or require inpatient management.  Patient is hemodynamically stable and in no acute distress.  Patient able to ambulate in department prior to ED.  Evaluation does not show acute  pathology that would require ongoing or additional emergent interventions while in the emergency department or further inpatient treatment.  I have discussed the diagnosis with the patient and answered all questions.  Patient has no further complaints prior to discharge.  Patient is comfortable with plan discussed in room and is stable for discharge at this time.  I have discussed strict return precautions for returning to the emergency department.  Patient was encouraged to follow-up with PCP/specialist refer to at discharge.     Final Clinical Impressions(s) / ED Diagnoses   Final diagnoses:  Closed nondisplaced fracture of fifth metatarsal bone of right foot, initial encounter  Elevated blood pressure reading    ED Discharge Orders    None       Lovetta Condie A, PA-C 04/25/19 1556    Vanetta MuldersZackowski, Scott, MD 04/25/19 1803

## 2019-04-27 ENCOUNTER — Ambulatory Visit (INDEPENDENT_AMBULATORY_CARE_PROVIDER_SITE_OTHER): Payer: PRIVATE HEALTH INSURANCE | Admitting: Orthopedic Surgery

## 2019-04-27 ENCOUNTER — Other Ambulatory Visit: Payer: Self-pay

## 2019-04-27 ENCOUNTER — Encounter: Payer: Self-pay | Admitting: Orthopedic Surgery

## 2019-04-27 VITALS — Temp 98.8°F

## 2019-04-27 DIAGNOSIS — S92354A Nondisplaced fracture of fifth metatarsal bone, right foot, initial encounter for closed fracture: Secondary | ICD-10-CM | POA: Diagnosis not present

## 2019-04-27 NOTE — Progress Notes (Signed)
Juan Woods  04/27/2019  HISTORY SECTION :  Chief Complaint  Patient presents with  . Foot Injury    right 04/25/2019   HPI The patient presents for evaluation of pain in the right foot after injury on July 15 missed a step complains of pain over the base of the fifth metatarsal.  Pain is mild its associated with some weightbearing pain but otherwise insignificant at this point  Review of Systems  All other systems reviewed and are negative.    Past Medical History:  Diagnosis Date  . Asthma   . Depression     Past Surgical History:  Procedure Laterality Date  . HERNIA REPAIR    . HIP SURGERY    . TESTICLE SURGERY    . WISDOM TOOTH EXTRACTION       No Known Allergies   Current Outpatient Medications:  .  acyclovir (ZOVIRAX) 400 MG tablet, Take 1 tablet (400 mg total) by mouth at bedtime. For Herpes infection, Disp: , Rfl:  .  escitalopram (LEXAPRO) 20 MG tablet, Take by mouth., Disp: , Rfl:  .  traZODone (DESYREL) 50 MG tablet, Take 1 tablet (50 mg total) by mouth at bedtime as needed for sleep., Disp: 30 tablet, Rfl: 0 .  albuterol (PROVENTIL HFA;VENTOLIN HFA) 108 (90 Base) MCG/ACT inhaler, Inhale 1-2 puffs into the lungs every 6 (six) hours as needed for wheezing or shortness of breath. (Patient not taking: Reported on 10/18/2018), Disp: , Rfl:  .  hydrOXYzine (ATARAX/VISTARIL) 25 MG tablet, Take 1 tablet (25 mg) Q 6 hours prn: For anxiety (Patient not taking: Reported on 10/18/2018), Disp: 60 tablet, Rfl: 0 .  triamcinolone ointment (KENALOG) 0.1 %, Apply 1 application topically 2 (two) times daily as needed. Do Not Use On Face (Patient not taking: Reported on 04/27/2019), Disp: 30 g, Rfl: 2   PHYSICAL EXAM SECTION: 1) Temp 98.8 F (37.1 C)   There is no height or weight on file to calculate BMI. General appearance: Well-developed well-nourished no gross deformities  2) Cardiovascular normal pulse and perfusion in all 4 extremities normal color without  edema  3) Neurologically deep tendon reflexes are equal and normal, no sensation loss or deficits no pathologic reflexes  4) Psychological: Awake alert and oriented x3 mood and affect normal  5) Skin no lacerations or ulcerations no nodularity no palpable masses, no erythema or nodularity  6) Musculoskeletal:   Left lower extremity looks normal I did reevaluate his patella he said he fell on it a couple weeks back feels a little sore.  He had normal range of motion and normal straight leg raise  His right foot was tender and swollen at the fifth metatarsal base neurovascular exam was intact strength was normal in dorsiflexion plantarflexion ankle stability was normal as well.Marland Kitchen  MEDICAL DECISION SECTION:  Encounter Diagnosis  Name Primary?  . Closed nondisplaced fracture of fifth metatarsal bone of right foot, initial encounter Yes    Imaging High rated hospital x-ray 3 views of the foot right foot fracture nondisplaced fifth metatarsal  Plan:  Recommend weightbearing as tolerated.  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  I am not sure they will let him go back to work with an open toe boot.  In any event we can make his notes accordingly otherwise follow-up x-ray in 4 weeks  Weightbearing status as tolerated in the cam walker    8:53 AM

## 2019-04-27 NOTE — Patient Instructions (Addendum)
WORK NOTE: RETURN Wednesday TO WORK

## 2019-05-02 ENCOUNTER — Telehealth: Payer: Self-pay

## 2019-05-02 NOTE — Telephone Encounter (Signed)
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  Please advise

## 2019-05-02 NOTE — Telephone Encounter (Signed)
Yes

## 2019-05-03 ENCOUNTER — Encounter: Payer: Self-pay | Admitting: Orthopedic Surgery

## 2019-05-03 NOTE — Telephone Encounter (Signed)
Note issued accordingly. Patient picked up.

## 2019-05-18 ENCOUNTER — Encounter: Payer: Self-pay | Admitting: Orthopedic Surgery

## 2019-05-28 ENCOUNTER — Ambulatory Visit: Payer: PRIVATE HEALTH INSURANCE | Admitting: Orthopedic Surgery

## 2019-06-06 ENCOUNTER — Ambulatory Visit: Payer: PRIVATE HEALTH INSURANCE | Admitting: Orthopedic Surgery

## 2019-06-08 ENCOUNTER — Ambulatory Visit (INDEPENDENT_AMBULATORY_CARE_PROVIDER_SITE_OTHER): Payer: PRIVATE HEALTH INSURANCE | Admitting: Orthopedic Surgery

## 2019-06-08 ENCOUNTER — Encounter: Payer: Self-pay | Admitting: Orthopedic Surgery

## 2019-06-08 ENCOUNTER — Ambulatory Visit: Payer: PRIVATE HEALTH INSURANCE

## 2019-06-08 ENCOUNTER — Telehealth: Payer: Self-pay | Admitting: Radiology

## 2019-06-08 ENCOUNTER — Other Ambulatory Visit: Payer: Self-pay

## 2019-06-08 VITALS — Temp 97.7°F

## 2019-06-08 DIAGNOSIS — S92354A Nondisplaced fracture of fifth metatarsal bone, right foot, initial encounter for closed fracture: Secondary | ICD-10-CM

## 2019-06-08 NOTE — Progress Notes (Signed)
Patient ID: Juan Woods, male   DOB: 07-31-1991, 28 y.o.   MRN: [id number]  FRACTURE CARE   Chief Complaint  Patient presents with  . Foot Injury    right foot fracture 04/25/19    Encounter Diagnosis  Name Primary?  . Closed nondisplaced fracture of fifth metatarsal bone of right foot, initial encounter Yes   This patient has a proximal fifth metatarsal fracture.  This is not a Jones fracture but more of the avulsion version.  He is doing well I had to go to surgery as an emergent procedure we do went ahead and took his x-ray I reviewed it he needed a light duty note for 2 weeks to limit his walking and standing but his x-ray looks good he can use his boot as tolerated and follow-up with [place] on an as-needed basis xrays see report: fracture looks stable

## 2019-06-08 NOTE — Telephone Encounter (Signed)
I called patient Dr Aline Brochure has me to let him know ok to d/c boot, continue light duty at work for 2 more weeks

## 2020-03-31 ENCOUNTER — Other Ambulatory Visit: Payer: Self-pay

## 2020-03-31 ENCOUNTER — Emergency Department (HOSPITAL_COMMUNITY): Payer: BC Managed Care – PPO

## 2020-03-31 ENCOUNTER — Encounter (HOSPITAL_COMMUNITY): Payer: Self-pay | Admitting: Emergency Medicine

## 2020-03-31 DIAGNOSIS — M25562 Pain in left knee: Secondary | ICD-10-CM | POA: Insufficient documentation

## 2020-03-31 DIAGNOSIS — Z5321 Procedure and treatment not carried out due to patient leaving prior to being seen by health care provider: Secondary | ICD-10-CM | POA: Diagnosis not present
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
## 2020-04-01 ENCOUNTER — Emergency Department (HOSPITAL_COMMUNITY)
Admission: EM | Admit: 2020-04-01 | Discharge: 2020-04-01 | Disposition: A | Discharge status: Home / Self Care | Payer: BC Managed Care – PPO | Attending: Emergency Medicine | Admitting: Emergency Medicine

## 2020-04-01 ENCOUNTER — Ambulatory Visit (INDEPENDENT_AMBULATORY_CARE_PROVIDER_SITE_OTHER): Payer: BC Managed Care – PPO

## 2020-04-01 ENCOUNTER — Ambulatory Visit (INDEPENDENT_AMBULATORY_CARE_PROVIDER_SITE_OTHER)
Admission: EM | Admit: 2020-04-01 | Discharge: 2020-04-01 | Disposition: A | Discharge status: Home / Self Care | Payer: BC Managed Care – PPO | Source: Home / Self Care

## 2020-04-01 DIAGNOSIS — M25562 Pain in left knee: Secondary | ICD-10-CM

## 2020-04-01 DIAGNOSIS — M25559 Pain in unspecified hip: Secondary | ICD-10-CM

## 2020-04-01 DIAGNOSIS — M25551 Pain in right hip: Secondary | ICD-10-CM

## 2020-04-01 MED ORDER — MUPIROCIN 2 % EX OINT: 1.0000 "application " | TOPICAL_OINTMENT | Freq: Two times a day (BID) | CUTANEOUS | 1 refills | Status: DC

## 2020-04-01 MED ORDER — NAPROXEN 500 MG PO TABS: 500.0000 mg | ORAL_TABLET | Freq: Two times a day (BID) | ORAL | 0 refills | Status: DC
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
## 2020-05-01 ENCOUNTER — Other Ambulatory Visit: Payer: Self-pay

## 2020-05-01 ENCOUNTER — Encounter (HOSPITAL_BASED_OUTPATIENT_CLINIC_OR_DEPARTMENT_OTHER): Payer: Self-pay | Admitting: Orthopedic Surgery

## 2020-05-02 ENCOUNTER — Other Ambulatory Visit (HOSPITAL_COMMUNITY)
Admission: RE | Admit: 2020-05-02 | Discharge: 2020-05-02 | Disposition: A | Discharge status: Home / Self Care | Payer: BC Managed Care – PPO | Source: Ambulatory Visit | Attending: Orthopedic Surgery | Admitting: Orthopedic Surgery

## 2020-05-02 DIAGNOSIS — Z20822 Contact with and (suspected) exposure to covid-19: Secondary | ICD-10-CM | POA: Insufficient documentation

## 2020-05-02 DIAGNOSIS — Z01812 Encounter for preprocedural laboratory examination: Secondary | ICD-10-CM | POA: Insufficient documentation

## 2020-05-02 LAB — SARS CORONAVIRUS 2 (TAT 6-24 HRS): SARS Coronavirus 2: NEGATIVE

## 2020-05-06 ENCOUNTER — Ambulatory Visit (HOSPITAL_BASED_OUTPATIENT_CLINIC_OR_DEPARTMENT_OTHER): Payer: BC Managed Care – PPO | Admitting: Anesthesiology

## 2020-05-06 ENCOUNTER — Encounter (HOSPITAL_BASED_OUTPATIENT_CLINIC_OR_DEPARTMENT_OTHER)
Admission: RE | Disposition: A | Discharge status: Home / Self Care | Payer: Self-pay | Source: Home / Self Care | Attending: Orthopedic Surgery

## 2020-05-06 ENCOUNTER — Other Ambulatory Visit: Payer: Self-pay

## 2020-05-06 ENCOUNTER — Ambulatory Visit (HOSPITAL_BASED_OUTPATIENT_CLINIC_OR_DEPARTMENT_OTHER)
Admission: RE | Admit: 2020-05-06 | Discharge: 2020-05-06 | Disposition: A | Discharge status: Home / Self Care | Payer: BC Managed Care – PPO | Attending: Orthopedic Surgery | Admitting: Orthopedic Surgery

## 2020-05-06 ENCOUNTER — Encounter (HOSPITAL_BASED_OUTPATIENT_CLINIC_OR_DEPARTMENT_OTHER): Payer: Self-pay | Admitting: Orthopedic Surgery

## 2020-05-06 DIAGNOSIS — S83242A Other tear of medial meniscus, current injury, left knee, initial encounter: Secondary | ICD-10-CM | POA: Diagnosis not present

## 2020-05-06 DIAGNOSIS — Z87891 Personal history of nicotine dependence: Secondary | ICD-10-CM | POA: Diagnosis not present

## 2020-05-06 DIAGNOSIS — Z79899 Other long term (current) drug therapy: Secondary | ICD-10-CM | POA: Diagnosis not present

## 2020-05-06 DIAGNOSIS — Z8249 Family history of ischemic heart disease and other diseases of the circulatory system: Secondary | ICD-10-CM | POA: Diagnosis not present

## 2020-05-06 DIAGNOSIS — J45909 Unspecified asthma, uncomplicated: Secondary | ICD-10-CM | POA: Diagnosis not present

## 2020-05-06 DIAGNOSIS — Z8269 Family history of other diseases of the musculoskeletal system and connective tissue: Secondary | ICD-10-CM | POA: Diagnosis not present

## 2020-05-06 DIAGNOSIS — Y9355 Activity, bike riding: Secondary | ICD-10-CM | POA: Insufficient documentation

## 2020-05-06 HISTORY — PX: KNEE ARTHROSCOPY WITH MEDIAL MENISECTOMY: SHX5651

## 2020-05-06 SURGERY — ARTHROSCOPY, KNEE, WITH MEDIAL MENISCECTOMY
Anesthesia: General | Site: Knee | Laterality: Left

## 2020-05-06 MED ORDER — MIDAZOLAM HCL 2 MG/2ML IJ SOLN
INTRAMUSCULAR | Status: AC
  Filled 2020-05-06: qty 2

## 2020-05-06 MED ORDER — DEXMEDETOMIDINE (PRECEDEX) IN NS 20 MCG/5ML (4 MCG/ML) IV SYRINGE
PREFILLED_SYRINGE | INTRAVENOUS | Status: AC
  Filled 2020-05-06: qty 5

## 2020-05-06 MED ORDER — FENTANYL CITRATE (PF) 100 MCG/2ML IJ SOLN
INTRAMUSCULAR | Status: AC
  Filled 2020-05-06: qty 2

## 2020-05-06 MED ORDER — CEFAZOLIN SODIUM-DEXTROSE 2-4 GM/100ML-% IV SOLN
INTRAVENOUS | Status: AC
  Filled 2020-05-06: qty 100

## 2020-05-06 MED ORDER — HYDROCODONE-ACETAMINOPHEN 5-325 MG PO TABS: 1.0000 | ORAL_TABLET | Freq: Four times a day (QID) | ORAL | 0 refills | Status: DC | PRN

## 2020-05-06 MED ORDER — OXYCODONE HCL 5 MG PO TABS
5.0000 mg | ORAL_TABLET | Freq: Once | ORAL | Status: AC | PRN
  Administered 2020-05-06: 5 mg via ORAL

## 2020-05-06 MED ORDER — HYDROMORPHONE HCL 1 MG/ML IJ SOLN
0.2500 mg | INTRAMUSCULAR | Status: DC | PRN
  Administered 2020-05-06 (×2): 0.5 mg via INTRAVENOUS

## 2020-05-06 MED ORDER — LACTATED RINGERS IV SOLN: INTRAVENOUS | Status: DC

## 2020-05-06 MED ORDER — DEXMEDETOMIDINE HCL 200 MCG/2ML IV SOLN
INTRAVENOUS | Status: DC | PRN
  Administered 2020-05-06: 8 ug via INTRAVENOUS
  Administered 2020-05-06: 12 ug via INTRAVENOUS

## 2020-05-06 MED ORDER — SODIUM CHLORIDE 0.9 % IR SOLN
Status: DC | PRN
  Administered 2020-05-06: 4500 mL

## 2020-05-06 MED ORDER — ACETAMINOPHEN 500 MG PO TABS
ORAL_TABLET | ORAL | Status: AC
  Filled 2020-05-06: qty 2

## 2020-05-06 MED ORDER — HYDROMORPHONE HCL 1 MG/ML IJ SOLN
INTRAMUSCULAR | Status: AC
  Filled 2020-05-06: qty 0.5

## 2020-05-06 MED ORDER — MIDAZOLAM HCL 5 MG/5ML IJ SOLN
INTRAMUSCULAR | Status: DC | PRN
  Administered 2020-05-06: 2 mg via INTRAVENOUS

## 2020-05-06 MED ORDER — BUPIVACAINE HCL (PF) 0.5 % IJ SOLN
INTRAMUSCULAR | Status: AC
  Filled 2020-05-06: qty 30

## 2020-05-06 MED ORDER — CEFAZOLIN SODIUM-DEXTROSE 2-4 GM/100ML-% IV SOLN
2.0000 g | INTRAVENOUS | Status: AC
  Administered 2020-05-06: 2 g via INTRAVENOUS

## 2020-05-06 MED ORDER — FENTANYL CITRATE (PF) 100 MCG/2ML IJ SOLN: 100.0000 ug | Freq: Once | INTRAMUSCULAR | Status: DC

## 2020-05-06 MED ORDER — OXYCODONE HCL 5 MG/5ML PO SOLN: 5.0000 mg | Freq: Once | ORAL | Status: AC | PRN

## 2020-05-06 MED ORDER — FENTANYL CITRATE (PF) 100 MCG/2ML IJ SOLN
INTRAMUSCULAR | Status: DC | PRN
  Administered 2020-05-06: 50 ug via INTRAVENOUS
  Administered 2020-05-06: 25 ug via INTRAVENOUS
  Administered 2020-05-06: 50 ug via INTRAVENOUS
  Administered 2020-05-06: 25 ug via INTRAVENOUS
  Administered 2020-05-06: 100 ug via INTRAVENOUS

## 2020-05-06 MED ORDER — DEXAMETHASONE SODIUM PHOSPHATE 10 MG/ML IJ SOLN
INTRAMUSCULAR | Status: AC
  Filled 2020-05-06: qty 1

## 2020-05-06 MED ORDER — OXYCODONE HCL 5 MG PO TABS
ORAL_TABLET | ORAL | Status: AC
  Filled 2020-05-06: qty 1

## 2020-05-06 MED ORDER — ONDANSETRON HCL 4 MG/2ML IJ SOLN
INTRAMUSCULAR | Status: DC | PRN
  Administered 2020-05-06: 4 mg via INTRAVENOUS

## 2020-05-06 MED ORDER — ACETAMINOPHEN 500 MG PO TABS
1000.0000 mg | ORAL_TABLET | Freq: Once | ORAL | Status: AC
  Administered 2020-05-06: 1000 mg via ORAL

## 2020-05-06 MED ORDER — DEXAMETHASONE SODIUM PHOSPHATE 10 MG/ML IJ SOLN
INTRAMUSCULAR | Status: DC | PRN
  Administered 2020-05-06: 10 mg via INTRAVENOUS

## 2020-05-06 MED ORDER — ONDANSETRON HCL 4 MG/2ML IJ SOLN
INTRAMUSCULAR | Status: AC
  Filled 2020-05-06: qty 2

## 2020-05-06 MED ORDER — PROMETHAZINE HCL 25 MG/ML IJ SOLN: 6.2500 mg | INTRAMUSCULAR | Status: DC | PRN

## 2020-05-06 MED ORDER — BUPIVACAINE HCL (PF) 0.5 % IJ SOLN
INTRAMUSCULAR | Status: DC | PRN
  Administered 2020-05-06: 20 mL via INTRA_ARTICULAR

## 2020-05-06 MED ORDER — ACETAMINOPHEN 500 MG PO TABS: 1000.0000 mg | ORAL_TABLET | Freq: Once | ORAL | Status: DC

## 2020-05-06 MED ORDER — MIDAZOLAM HCL 2 MG/2ML IJ SOLN: 2.0000 mg | Freq: Once | INTRAMUSCULAR | Status: DC

## 2020-05-06 MED ORDER — KETOROLAC TROMETHAMINE 30 MG/ML IJ SOLN: 30.0000 mg | Freq: Once | INTRAMUSCULAR | Status: DC | PRN

## 2020-05-06 MED ORDER — PROPOFOL 10 MG/ML IV BOLUS
INTRAVENOUS | Status: DC | PRN
  Administered 2020-05-06: 50 mg via INTRAVENOUS
  Administered 2020-05-06: 200 mg via INTRAVENOUS

## 2020-05-06 SURGICAL SUPPLY — 74 items
BANDAGE ESMARK 6X9 LF (GAUZE/BANDAGES/DRESSINGS) IMPLANT
BLADE EXCALIBUR 4.0X13 (MISCELLANEOUS) IMPLANT
BLADE SURG 15 STRL LF DISP TIS (BLADE) ×2 IMPLANT
BLADE SURG 15 STRL SS (BLADE) ×3
BNDG CMPR 9X6 STRL LF SNTH (GAUZE/BANDAGES/DRESSINGS)
BNDG ELASTIC 6X5.8 VLCR STR LF (GAUZE/BANDAGES/DRESSINGS) ×3 IMPLANT
BNDG ESMARK 6X9 LF (GAUZE/BANDAGES/DRESSINGS)
BURR OVAL 8 FLU 4.0X13 (MISCELLANEOUS) IMPLANT
CLSR STERI-STRIP ANTIMIC 1/2X4 (GAUZE/BANDAGES/DRESSINGS) ×3 IMPLANT
COVER BACK TABLE 60X90IN (DRAPES) ×3 IMPLANT
COVER WAND RF STERILE (DRAPES) IMPLANT
CUFF TOURN SGL QUICK 34 (TOURNIQUET CUFF) ×3
CUFF TRNQT CYL 34X4.125X (TOURNIQUET CUFF) ×2 IMPLANT
CUTTER TENSIONER SUT 2-0 0 FBW (INSTRUMENTS) IMPLANT
DECANTER SPIKE VIAL GLASS SM (MISCELLANEOUS) IMPLANT
DISSECTOR  3.8MM X 13CM (MISCELLANEOUS) ×1
DISSECTOR 3.8MM X 13CM (MISCELLANEOUS) ×2 IMPLANT
DISSECTOR 4.0MM X 13CM (MISCELLANEOUS) IMPLANT
DRAPE ARTHROSCOPY W/POUCH 90 (DRAPES) ×3 IMPLANT
DRAPE IMP U-DRAPE 54X76 (DRAPES) ×3 IMPLANT
DRAPE OEC MINIVIEW 54X84 (DRAPES) IMPLANT
DRAPE TOP ARMCOVERS (MISCELLANEOUS) ×3 IMPLANT
DRAPE U-SHAPE 47X51 STRL (DRAPES) ×3 IMPLANT
DRILL FLIPCUTTER III 6-12 (ORTHOPEDIC DISPOSABLE SUPPLIES) IMPLANT
DURAPREP 26ML APPLICATOR (WOUND CARE) ×3 IMPLANT
ELECT MENISCUS 165MM 90D (ELECTRODE) IMPLANT
ELECT REM PT RETURN 9FT ADLT (ELECTROSURGICAL) ×3
ELECTRODE REM PT RTRN 9FT ADLT (ELECTROSURGICAL) ×2 IMPLANT
EXCALIBUR 3.8MM X 13CM (MISCELLANEOUS) IMPLANT
FIBERSTICK 2 (SUTURE) IMPLANT
FLIPCUTTER III 6-12 AR-1204FF (ORTHOPEDIC DISPOSABLE SUPPLIES)
GAUZE SPONGE 4X4 12PLY STRL (GAUZE/BANDAGES/DRESSINGS) ×3 IMPLANT
GLOVE BIO SURGEON STRL SZ7 (GLOVE) ×3 IMPLANT
GLOVE BIOGEL PI IND STRL 7.0 (GLOVE) ×6 IMPLANT
GLOVE BIOGEL PI IND STRL 8 (GLOVE) ×2 IMPLANT
GLOVE BIOGEL PI INDICATOR 7.0 (GLOVE) ×3
GLOVE BIOGEL PI INDICATOR 8 (GLOVE) ×1
GLOVE ECLIPSE 6.5 STRL STRAW (GLOVE) ×3 IMPLANT
GLOVE ORTHO TXT STRL SZ7.5 (GLOVE) ×3 IMPLANT
GOWN STRL REUS W/ TWL LRG LVL3 (GOWN DISPOSABLE) ×4 IMPLANT
GOWN STRL REUS W/ TWL XL LVL3 (GOWN DISPOSABLE) ×4 IMPLANT
GOWN STRL REUS W/TWL LRG LVL3 (GOWN DISPOSABLE) ×6
GOWN STRL REUS W/TWL XL LVL3 (GOWN DISPOSABLE) ×6
IMMOBILIZER KNEE 22 UNIV (SOFTGOODS) IMPLANT
IMMOBILIZER KNEE 24 THIGH 36 (MISCELLANEOUS) IMPLANT
IMMOBILIZER KNEE 24 UNIV (MISCELLANEOUS)
IV NS IRRIG 3000ML ARTHROMATIC (IV SOLUTION) ×6 IMPLANT
KIT TRANSTIBIAL (DISPOSABLE) IMPLANT
KNEE WRAP E Z 3 GEL PACK (MISCELLANEOUS) ×3 IMPLANT
MANIFOLD NEPTUNE II (INSTRUMENTS) ×3 IMPLANT
NS IRRIG 1000ML POUR BTL (IV SOLUTION) IMPLANT
PACK ARTHROSCOPY DSU (CUSTOM PROCEDURE TRAY) ×3 IMPLANT
PACK BASIN DAY SURGERY FS (CUSTOM PROCEDURE TRAY) ×3 IMPLANT
PADDING CAST COTTON 6X4 STRL (CAST SUPPLIES) ×3 IMPLANT
PENCIL SMOKE EVACUATOR (MISCELLANEOUS) IMPLANT
PORT APPOLLO RF 90DEGREE MULTI (SURGICAL WAND) IMPLANT
SLEEVE SCD COMPRESS KNEE MED (MISCELLANEOUS) ×3 IMPLANT
SPONGE LAP 4X18 RFD (DISPOSABLE) ×3 IMPLANT
SUCTION FRAZIER HANDLE 10FR (MISCELLANEOUS)
SUCTION TUBE FRAZIER 10FR DISP (MISCELLANEOUS) IMPLANT
SUT MNCRL AB 4-0 PS2 18 (SUTURE) ×3 IMPLANT
SUT VIC AB 0 CT1 27 (SUTURE)
SUT VIC AB 0 CT1 27XBRD ANBCTR (SUTURE) IMPLANT
SUT VIC AB 2-0 SH 27 (SUTURE)
SUT VIC AB 2-0 SH 27XBRD (SUTURE) IMPLANT
SUT VIC AB 3-0 SH 27 (SUTURE)
SUT VIC AB 3-0 SH 27X BRD (SUTURE) IMPLANT
SUT VICRYL 3-0 CR8 SH (SUTURE) IMPLANT
SUT VICRYL 4-0 PS2 18IN ABS (SUTURE) IMPLANT
SUTURE TAPE 1.3 FIBERLOP 20 ST (SUTURE) IMPLANT
SUTURETAPE 1.3 FIBERLOOP 20 ST (SUTURE)
TOWEL GREEN STERILE FF (TOWEL DISPOSABLE) ×9 IMPLANT
TUBING ARTHROSCOPY IRRIG 16FT (MISCELLANEOUS) ×3 IMPLANT
WATER STERILE IRR 1000ML POUR (IV SOLUTION) IMPLANT
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
## 2020-05-06 NOTE — Discharge Instructions (Signed)
No Tylenol until 5:08 pm  OXYCODONE 5 MG gIVEN AT 3:25 PM   Post Anesthesia Home Care Instructions  Activity: Get plenty of rest for the remainder of the day. A responsible individual must stay with you for 24 hours following the procedure.  For the next 24 hours, DO NOT: -Drive a car -Advertising copywriter -Drink alcoholic beverages -Take any medication unless instructed by your physician -Make any legal decisions or sign important papers.  Meals: Start with liquid foods such as gelatin or soup. Progress to regular foods as tolerated. Avoid greasy, spicy, heavy foods. If nausea and/or vomiting occur, drink only clear liquids until the nausea and/or vomiting subsides. Call your physician if vomiting continues.  Special Instructions/Symptoms: Your throat may feel dry or sore from the anesthesia or the breathing tube placed in your throat during surgery. If this causes discomfort, gargle with warm salt water. The discomfort should disappear within 24 hours.  If you had a scopolamine patch placed behind your ear for the management of post- operative nausea and/or vomiting:  1. The medication in the patch is effective for 72 hours, after which it should be removed.  Wrap patch in a tissue and discard in the trash. Wash hands thoroughly with soap and water. 2. You may remove the patch earlier than 72 hours if you experience unpleasant side effects which may include dry mouth, dizziness or visual disturbances. 3. Avoid touching the patch. Wash your hands with soap and water after contact with the patch.   Diet: As you were doing prior to hospitalization   Shower:  May shower but keep the wounds dry, use an occlusive plastic wrap, NO SOAKING IN TUB.  If the bandage gets wet, change with a clean dry gauze.  If you have a splint on, leave the splint in place and keep the splint dry with a plastic bag.  Dressing:  You may change your dressing 3-5 days after surgery, unless you have a splint.  If  you have a splint, then just leave the splint in place and we will change your bandages during your first follow-up appointment.    If you had hand or foot surgery, we will plan to remove your stitches in about 2 weeks in the office.  For all other surgeries, there are sticky tapes (steri-strips) on your wounds and all the stitches are absorbable.  Leave the steri-strips in place when changing your dressings, they will peel off with time, usually 2-3 weeks.  Activity:  Increase activity slowly as tolerated, but follow the weight bearing instructions below.  The rules on driving is that you can not be taking narcotics while you drive, and you must feel in control of the vehicle.    Weight Bearing:   Weight bearing as tolerated.    To prevent constipation: you may use a stool softener such as -  Colace (over the counter) 100 mg by mouth twice a day  Drink plenty of fluids (prune juice may be helpful) and high fiber foods Miralax (over the counter) for constipation as needed.    Itching:  If you experience itching with your medications, try taking only a single pain pill, or even half a pain pill at a time.  You may take up to 10 pain pills per day, and you can also use benadryl over the counter for itching or also to help with sleep.   Precautions:  If you experience chest pain or shortness of breath - call 911 immediately for transfer to  the hospital emergency department!!  If you develop a fever greater that 101 F, purulent drainage from wound, increased redness or drainage from wound, or calf pain -- Call the office at [phone]                                                Follow- Up Appointment:  Please call for an appointment to be seen in 2 weeks [place] - [phone]

## 2020-05-06 NOTE — Anesthesia Procedure Notes (Signed)
Procedure Name: LMA Insertion Date/Time: 05/06/2020 1:00 PM Performed by: Montez Morita, Alivea Gladson W, CRNA Pre-anesthesia Checklist: Patient identified, Emergency Drugs available, Suction available and Patient being monitored Patient Re-evaluated:Patient Re-evaluated prior to induction Oxygen Delivery Method: Circle system utilized Preoxygenation: Pre-oxygenation with 100% oxygen Induction Type: IV induction Ventilation: Mask ventilation without difficulty LMA: LMA inserted LMA Size: 4.0 Number of attempts: 1 Placement Confirmation: positive ETCO2 and breath sounds checked- equal and bilateral Tube secured with: Tape Dental Injury: Teeth and Oropharynx as per pre-operative assessment

## 2020-05-06 NOTE — Anesthesia Preprocedure Evaluation (Addendum)
Anesthesia Evaluation  Patient identified by MRN, date of birth, ID band Patient awake    Reviewed: Allergy & Precautions, NPO status , Patient's Chart, lab work & pertinent test results  Airway Mallampati: II  TM Distance: >3 FB Neck ROM: Full    Dental no notable dental hx.    Pulmonary asthma ,    Pulmonary exam normal breath sounds clear to auscultation       Cardiovascular negative cardio ROS Normal cardiovascular exam Rhythm:Regular Rate:Normal     Neuro/Psych PSYCHIATRIC DISORDERS Depression negative neurological ROS     GI/Hepatic negative GI ROS, (+)     substance abuse  alcohol use,   Endo/Other  negative endocrine ROS  Renal/GU negative Renal ROS     Musculoskeletal negative musculoskeletal ROS (+)   Abdominal   Peds  Hematology negative hematology ROS (+)   Anesthesia Other Findings LEFT KNEE MEDIAL MENISCUS AND ACL TEAR  Reproductive/Obstetrics                            Anesthesia Physical Anesthesia Plan  ASA: II  Anesthesia Plan: General   Post-op Pain Management:    Induction: Intravenous  PONV Risk Score and Plan: 2 and Ondansetron, Dexamethasone, Midazolam and Treatment may vary due to age or medical condition  Airway Management Planned: LMA  Additional Equipment:   Intra-op Plan:   Post-operative Plan: Extubation in OR  Informed Consent: I have reviewed the patients History and Physical, chart, labs and discussed the procedure including the risks, benefits and alternatives for the proposed anesthesia with the patient or authorized representative who has indicated his/her understanding and acceptance.     Dental advisory given  Plan Discussed with: CRNA  Anesthesia Plan Comments: (Potential regional anesthesia discussed with patient. )      Anesthesia Quick Evaluation

## 2020-05-06 NOTE — Transfer of Care (Signed)
Immediate Anesthesia Transfer of Care Note  Patient: Juan Woods  Procedure(s) Performed: KNEE ARTHROSCOPY WITH MEDIAL MENISCUS . MENISECTOMY (Left Knee)  Patient Location: PACU  Anesthesia Type:General  Level of Consciousness: awake and alert   Airway & Oxygen Therapy: Patient Spontanous Breathing and Patient connected to face mask oxygen  Post-op Assessment: Report given to RN and Post -op Vital signs reviewed and stable  Post vital signs: Reviewed and stable  Last Vitals:  Vitals Value Taken Time  BP    Temp    Pulse    Resp 17 05/06/20 1423  SpO2    Vitals shown include unvalidated device data.  Last Pain:  Vitals:   05/06/20 1053  TempSrc: Oral  PainSc: 0-No pain      Patients Stated Pain Goal: 6 (05/06/20 1053)  Complications: No complications documented.

## 2020-05-07 ENCOUNTER — Encounter (HOSPITAL_BASED_OUTPATIENT_CLINIC_OR_DEPARTMENT_OTHER): Payer: Self-pay | Admitting: Orthopedic Surgery

## 2020-05-08 NOTE — Anesthesia Postprocedure Evaluation (Signed)
Anesthesia Post Note  Patient: Juan Woods  Procedure(s) Performed: KNEE ARTHROSCOPY WITH MEDIAL MENISCUS . MENISECTOMY (Left Knee)     Patient location during evaluation: PACU Anesthesia Type: General Level of consciousness: awake and alert Pain management: pain level controlled Vital Signs Assessment: post-procedure vital signs reviewed and stable Respiratory status: spontaneous breathing, nonlabored ventilation, respiratory function stable and patient connected to nasal cannula oxygen Cardiovascular status: blood pressure returned to baseline and stable Postop Assessment: no apparent nausea or vomiting Anesthetic complications: no   No complications documented.  Last Vitals:  Vitals:   05/06/20 1500 05/06/20 1543  BP: 119/67 123/79  Pulse: 74 69  Resp: 17 16  Temp:  36.7 C  SpO2: 99% 97%    Last Pain:  Vitals:   05/07/20 1022  TempSrc:   PainSc: 2                  Eppie Barhorst

## 2021-09-21 ENCOUNTER — Emergency Department (HOSPITAL_COMMUNITY)
Admission: EM | Admit: 2021-09-21 | Discharge: 2021-09-21 | Disposition: A | Discharge status: Home / Self Care | Payer: BC Managed Care – PPO | Attending: Emergency Medicine | Admitting: Emergency Medicine

## 2021-09-21 ENCOUNTER — Emergency Department (HOSPITAL_COMMUNITY): Payer: BC Managed Care – PPO

## 2021-09-21 ENCOUNTER — Other Ambulatory Visit: Payer: Self-pay

## 2021-09-21 ENCOUNTER — Encounter (HOSPITAL_COMMUNITY): Payer: Self-pay

## 2021-09-21 DIAGNOSIS — J3489 Other specified disorders of nose and nasal sinuses: Secondary | ICD-10-CM | POA: Diagnosis not present

## 2021-09-21 DIAGNOSIS — J45909 Unspecified asthma, uncomplicated: Secondary | ICD-10-CM | POA: Insufficient documentation

## 2021-09-21 DIAGNOSIS — J069 Acute upper respiratory infection, unspecified: Secondary | ICD-10-CM | POA: Diagnosis not present

## 2021-09-21 DIAGNOSIS — Z87891 Personal history of nicotine dependence: Secondary | ICD-10-CM | POA: Insufficient documentation

## 2021-09-21 DIAGNOSIS — R509 Fever, unspecified: Secondary | ICD-10-CM | POA: Diagnosis present

## 2021-09-21 MED ORDER — PREDNISONE 50 MG PO TABS
60.0000 mg | ORAL_TABLET | Freq: Once | ORAL | Status: AC
  Administered 2021-09-21: 60 mg via ORAL
  Filled 2021-09-21: qty 1

## 2021-09-21 MED ORDER — ALBUTEROL SULFATE HFA 108 (90 BASE) MCG/ACT IN AERS: 2.0000 | INHALATION_SPRAY | RESPIRATORY_TRACT | 0 refills | Status: AC | PRN

## 2021-09-21 MED ORDER — PREDNISONE 50 MG PO TABS: 50.0000 mg | ORAL_TABLET | Freq: Every day | ORAL | 0 refills | Status: AC

## 2021-09-21 MED ORDER — IPRATROPIUM-ALBUTEROL 0.5-2.5 (3) MG/3ML IN SOLN
3.0000 mL | Freq: Once | RESPIRATORY_TRACT | Status: AC
  Administered 2021-09-21: 3 mL via RESPIRATORY_TRACT
  Filled 2021-09-21: qty 3

## 2021-09-21 NOTE — ED Provider Notes (Signed)
[facility] EMERGENCY DEPARTMENT Provider Note   CSN: [id number] Arrival date & time: 09/21/21  8016     History Chief complaint: Cough, shortness of breath   Juan Woods is a 30 y.o. male.  The history is provided by the patient.  He has history of asthma and comes in with flulike illness which has been present for about the last 2.5 weeks.  He has had subjective fever with associated chills and sweats, but none for the last several days.  There has been some brownish rhinorrhea and cough productive of greenish to brownish sputum.  He has also developed a sore throat.  He initially was put on a steroid Dosepak which did seem to help him, but symptoms got worse when he completed the steroid pack.  He saw his primary care provider 3 days ago who decided to treat him for possible strep throat and gave him a prescription for amoxicillin.  His throat is actually slightly improved since then, but it has not been a major improvement.  He has had some generalized body aches, but not for the last week.  There has been no vomiting or diarrhea.  He had been vaccinated for influenza this year.   Past Medical History:  Diagnosis Date   Asthma    Depression     Patient Active Problem List   Diagnosis Date Noted   Acute medial meniscus tear of left knee 05/06/2020   Alcohol use disorder, moderate, dependence (HCC) 02/21/2017    Past Surgical History:  Procedure Laterality Date   HERNIA REPAIR     HIP SURGERY     KNEE ARTHROSCOPY WITH MEDIAL MENISECTOMY Left 05/06/2020   Procedure: KNEE ARTHROSCOPY WITH MEDIAL MENISCUS . MENISECTOMY;  Surgeon: Teryl Lucy, MD;  Location: [facility];  Service: Orthopedics;  Laterality: Left;   TESTICLE SURGERY     WISDOM TOOTH EXTRACTION         Family History  Problem Relation Age of Onset   Fibromyalgia Mother    Hypertension Mother    Hypertension Father     Social History   Tobacco Use   Smoking status: Never    Smokeless tobacco: Former    Quit date: 02/07/2017  Vaping Use   Vaping Use: Former  Substance Use Topics   Alcohol use: Yes    Comment: daily   Drug use: No    Home Medications Prior to Admission medications   Medication Sig Start Date End Date Taking? Authorizing Provider  acyclovir (ZOVIRAX) 400 MG tablet Take 1 tablet (400 mg total) by mouth at bedtime. For Herpes infection 02/25/17   Armandina Stammer I, NP  cetirizine (ZYRTEC) 10 MG tablet Take 10 mg by mouth daily.    [provider]  escitalopram (LEXAPRO) 20 MG tablet Take by mouth.    [provider]  HYDROcodone-acetaminophen (NORCO) 5-325 MG tablet Take 1-2 tablets by mouth every 6 (six) hours as needed for moderate pain. MAXIMUM TOTAL ACETAMINOPHEN DOSE IS 4000 MG PER DAY 05/06/20   Janine Ores K, PA-C  traZODone (DESYREL) 50 MG tablet Take 1 tablet (50 mg total) by mouth at bedtime as needed for sleep. 02/25/17   Armandina Stammer I, NP  albuterol (PROVENTIL HFA;VENTOLIN HFA) 108 (90 Base) MCG/ACT inhaler Inhale 1-2 puffs into the lungs every 6 (six) hours as needed for wheezing or shortness of breath. Patient not taking: Reported on 10/18/2018 02/25/17 04/01/20  Sanjuana Kava, NP    Allergies    Patient  has no known allergies.  Review of Systems   Review of Systems  All other systems reviewed and are negative.  Physical Exam Updated Vital Signs BP 140/79   Pulse 85   Temp 98 F (36.7 C)   Resp 18   Ht 6' (1.829 m)   Wt 83.9 kg   SpO2 99%   BMI 25.09 kg/m   Physical Exam Vitals and nursing note reviewed.  30 year old male, resting comfortably and in no acute distress. Vital signs are normal. Oxygen saturation is 99%, which is normal. Head is normocephalic and atraumatic. PERRLA, EOMI. Oropharynx is shows moderate erythema with mild tonsillar hypertrophy.  No exudate is seen.  There is no pooling of secretions, and phonation is normal.  There is no sinus tenderness. Neck is nontender and supple without  adenopathy or JVD. Back is nontender and there is no CVA tenderness. Lungs have a slightly prolonged exhalation phase, slight wheezing noted with forced exhalation.  There are no rales or rhonchi. Chest is nontender. Heart has regular rate and rhythm without murmur. Abdomen is soft, flat, nontender. Extremities have no cyanosis or edema, full range of motion is present. Skin is warm and dry without rash. Neurologic: Mental status is normal, cranial nerves are intact, moves all extremities equally.  ED Results / Procedures / Treatments    Radiology No results found.  Procedures Procedures   Medications Ordered in ED Medications  predniSONE (DELTASONE) tablet 60 mg (has no administration in time range)  ipratropium-albuterol (DUONEB) 0.5-2.5 (3) MG/3ML nebulizer solution 3 mL (3 mLs Nebulization Given 09/21/21 0551)    ED Course  I have reviewed the triage vital signs and the nursing notes.  Pertinent imaging results that were available during my care of the patient were reviewed by me and considered in my medical decision making (see chart for details).   MDM Rules/Calculators/A&P                         Viral URI with persistent symptoms.  He is outside the treatment window for antiviral treatment for either influenza or COVID-19, so no indication for respiratory pathogen testing.  He has failed to respond to amoxicillin, no indication for testing for strep.  Old records are reviewed, and he has no relevant past visits.  Will check chest x-ray to rule out superimposed pneumonia.  He will be given therapeutic trial of albuterol with ipratropium.  Chest x-ray shows no evidence of pneumonia.  He did note symptomatic improvement with nebulizer treatment.  He is given at dose of prednisone and is discharged with prescriptions for albuterol inhaler and prednisone.  Follow-up with PCP.  Final Clinical Impression(s) / ED Diagnoses Final diagnoses:  URI with cough and congestion    Rx  / DC Orders ED Discharge Orders          Ordered    predniSONE (DELTASONE) 50 MG tablet  Daily        09/21/21 0608    albuterol (VENTOLIN HFA) 108 (90 Base) MCG/ACT inhaler  Every 4 hours PRN        09/21/21 0608             Dione Booze, MD 09/22/21 [phone]

## 2021-09-21 NOTE — ED Notes (Signed)
Pt to XR

## 2021-09-21 NOTE — ED Triage Notes (Signed)
Dx with the flu 1.5 weeks ago.  Has white spots on throat and was given amoxicillin Friday for strep.

## 2021-09-21 NOTE — Discharge Instructions (Addendum)
Drink plenty of fluids.  Take acetaminophen or ibuprofen as needed for fever or aching.  Continue taking the antibiotic which was prescribed for you 3 days ago.  Return if symptoms are getting worse.
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
## 2022-03-16 ENCOUNTER — Other Ambulatory Visit: Payer: Self-pay | Admitting: Family Medicine

## 2022-03-16 ENCOUNTER — Other Ambulatory Visit (HOSPITAL_COMMUNITY): Payer: Self-pay | Admitting: Family Medicine

## 2022-03-16 DIAGNOSIS — M25461 Effusion, right knee: Secondary | ICD-10-CM

## 2022-04-01 ENCOUNTER — Ambulatory Visit (HOSPITAL_COMMUNITY)
Admission: RE | Admit: 2022-04-01 | Discharge: 2022-04-01 | Disposition: A | Discharge status: Home / Self Care | Payer: BC Managed Care – PPO | Source: Ambulatory Visit | Attending: Family Medicine | Admitting: Family Medicine

## 2022-04-01 DIAGNOSIS — M25461 Effusion, right knee: Secondary | ICD-10-CM | POA: Insufficient documentation

## 2023-04-08 ENCOUNTER — Ambulatory Visit
Admission: EM | Admit: 2023-04-08 | Discharge: 2023-04-08 | Disposition: A | Discharge status: Home / Self Care | Payer: BC Managed Care – PPO

## 2023-04-08 DIAGNOSIS — L0291 Cutaneous abscess, unspecified: Secondary | ICD-10-CM

## 2023-04-08 MED ORDER — DOXYCYCLINE HYCLATE 100 MG PO CAPS: 100.0000 mg | ORAL_CAPSULE | Freq: Two times a day (BID) | ORAL | 0 refills | Status: AC

## 2023-04-08 NOTE — ED Provider Notes (Signed)
[facility]    CSN: [id number] Arrival date & time: 04/08/23  1152      History   Chief Complaint Chief Complaint  Patient presents with   Insect Bite    HPI Juan Woods is a 32 y.o. male.   Patient presenting today with a painful swollen area to the right forearm for the past 1.5 weeks.  Thinks it was initially from a spider bite.  States the area has become increasingly larger and more painful over time and now feeling some numbness in the surrounding areas.  Denies fever, chills, drainage, bleeding, decreased range of motion to the extremity.  So far trying hydrocortisone cream with no relief.    Past Medical History:  Diagnosis Date   Asthma    Depression     Patient Active Problem List   Diagnosis Date Noted   Acute medial meniscus tear of left knee 05/06/2020   Alcohol use disorder, moderate, dependence (HCC) 02/21/2017    Past Surgical History:  Procedure Laterality Date   HERNIA REPAIR     HIP SURGERY     KNEE ARTHROSCOPY WITH MEDIAL MENISECTOMY Left 05/06/2020   Procedure: KNEE ARTHROSCOPY WITH MEDIAL MENISCUS . MENISECTOMY;  Surgeon: Teryl Lucy, MD;  Location: [facility];  Service: Orthopedics;  Laterality: Left;   TESTICLE SURGERY     WISDOM TOOTH EXTRACTION         Home Medications    Prior to Admission medications   Medication Sig Start Date End Date Taking? Authorizing Provider  buPROPion (WELLBUTRIN SR) 150 MG 12 hr tablet Take 75 mg by mouth daily. 03/23/23  Yes [provider]  doxycycline (VIBRAMYCIN) 100 MG capsule Take 1 capsule (100 mg total) by mouth 2 (two) times daily. 04/08/23  Yes Particia Nearing, PA-C  albuterol (VENTOLIN HFA) 108 (90 Base) MCG/ACT inhaler Inhale 2 puffs into the lungs every 4 (four) hours as needed for wheezing or shortness of breath. 09/21/21   Dione Booze, MD  cetirizine (ZYRTEC) 10 MG tablet Take 10 mg by mouth daily.    [provider]  escitalopram  (LEXAPRO) 20 MG tablet Take by mouth.    [provider]  predniSONE (DELTASONE) 50 MG tablet Take 1 tablet (50 mg total) by mouth daily. 09/21/21   Dione Booze, MD  traZODone (DESYREL) 50 MG tablet Take 1 tablet (50 mg total) by mouth at bedtime as needed for sleep. 02/25/17   Sanjuana Kava, NP    Family History Family History  Problem Relation Age of Onset   Fibromyalgia Mother    Hypertension Mother    Hypertension Father     Social History Social History   Tobacco Use   Smoking status: Never   Smokeless tobacco: Former    Quit date: 02/07/2017  Vaping Use   Vaping Use: Former  Substance Use Topics   Alcohol use: Yes    Comment: daily   Drug use: No     Allergies   Penicillins   Review of Systems Review of Systems Per HPI  Physical Exam Triage Vital Signs ED Triage Vitals  Enc Vitals Group     BP 04/08/23 1205 131/73     Pulse Rate 04/08/23 1205 92     Resp 04/08/23 1205 18     Temp 04/08/23 1205 98.2 F (36.8 C)     Temp Source 04/08/23 1205 Oral     SpO2 04/08/23 1205 98 %     Weight --  Height --      Head Circumference --      Peak Flow --      Pain Score 04/08/23 1206 6     Pain Loc --      Pain Edu? --      Excl. in GC? --    No data found.  Updated Vital Signs BP 131/73   Pulse 92   Temp 98.2 F (36.8 C) (Oral)   Resp 18   SpO2 98%   Visual Acuity Right Eye Distance:   Left Eye Distance:   Bilateral Distance:    Right Eye Near:   Left Eye Near:    Bilateral Near:     Physical Exam Vitals and nursing note reviewed.  Constitutional:      Appearance: Normal appearance.  HENT:     Head: Atraumatic.  Eyes:     Extraocular Movements: Extraocular movements intact.     Conjunctiva/sclera: Conjunctivae normal.  Cardiovascular:     Rate and Rhythm: Normal rate and regular rhythm.  Pulmonary:     Effort: Pulmonary effort is normal.     Breath sounds: Normal breath sounds.  Musculoskeletal:        General:  Tenderness present. Normal range of motion.     Cervical back: Normal range of motion and neck supple.  Skin:    General: Skin is warm and dry.     Findings: Erythema present.     Comments: 1 to 1.5 cm hyperpigmented fluctuant abscess present to right forearm, no active drainage or bleeding.  Tender to palpation  Neurological:     General: No focal deficit present.     Mental Status: He is oriented to person, place, and time.     Motor: No weakness.     Gait: Gait normal.     Comments: Right upper extremity neurovascularly intact  Psychiatric:        Mood and Affect: Mood normal.        Thought Content: Thought content normal.        Judgment: Judgment normal.      UC Treatments / Results  Labs (all labs ordered are listed, but only abnormal results are displayed) Labs Reviewed - No data to display  EKG   Radiology No results found.  Procedures Procedures (including critical care time)  Medications Ordered in UC Medications - No data to display  Initial Impression / Assessment and Plan / UC Course  I have reviewed the triage vital signs and the nursing notes.  Pertinent labs & imaging results that were available during my care of the patient were reviewed by me and considered in my medical decision making (see chart for details).     Area does appear to be mildly infected, will forego I&D as area is relatively small and shallow at this time and treat with warm compresses, antibiotics, Neosporin and elevation.  Return for any worsening symptoms.  Patient agreeable to plan.  Final Clinical Impressions(s) / UC Diagnoses   Final diagnoses:  Abscess     Discharge Instructions      Apply warm compresses off-and-on throughout the day to the area and keep covered with either a patch bandage or nonstick gauze with paper tape if you are going to be sweating or any dirty environment.  You may take ibuprofen or Tylenol as needed for pain, the ibuprofen will help with  inflammation as well.  Elevate the arm at rest to help with swelling.  Take the full course  of antibiotics and may apply Neosporin ointment if desired directly to the area.  Follow-up if the area is not significantly improving over the next 3-4 days.    ED Prescriptions     Medication Sig Dispense Auth. Provider   doxycycline (VIBRAMYCIN) 100 MG capsule Take 1 capsule (100 mg total) by mouth 2 (two) times daily. 20 capsule Particia Nearing, [place]      PDMP not reviewed this encounter.   Particia Nearing, [place] 04/08/23 1338

## 2023-04-08 NOTE — Discharge Instructions (Addendum)
Apply warm compresses off-and-on throughout the day to the area and keep covered with either a patch bandage or nonstick gauze with paper tape if you are going to be sweating or any dirty environment.  You may take ibuprofen or Tylenol as needed for pain, the ibuprofen will help with inflammation as well.  Elevate the arm at rest to help with swelling.  Take the full course of antibiotics and may apply Neosporin ointment if desired directly to the area.  Follow-up if the area is not significantly improving over the next 3-4 days.

## 2023-04-08 NOTE — ED Triage Notes (Signed)
Pt reports to [facility] for a spider bite on his right forearm.x 1.5 weeks. It is red, and has gotten bigger. States he had some right elbow numbness. Applied hydrocortisone cream

## 2023-06-16 IMAGING — DX DG CHEST 2V
2 series · 2 of 2 positions shown · non-contrast
Comparison: Report of [HOSPITAL] [facility] chest radiograph
11/16/2015 (no images available).

CLINICAL DATA: 30-year-old male with cough. Diagnosed with
influenza 1.5 weeks ago.

EXAM:
CHEST - 2 VIEW

[chest pa]
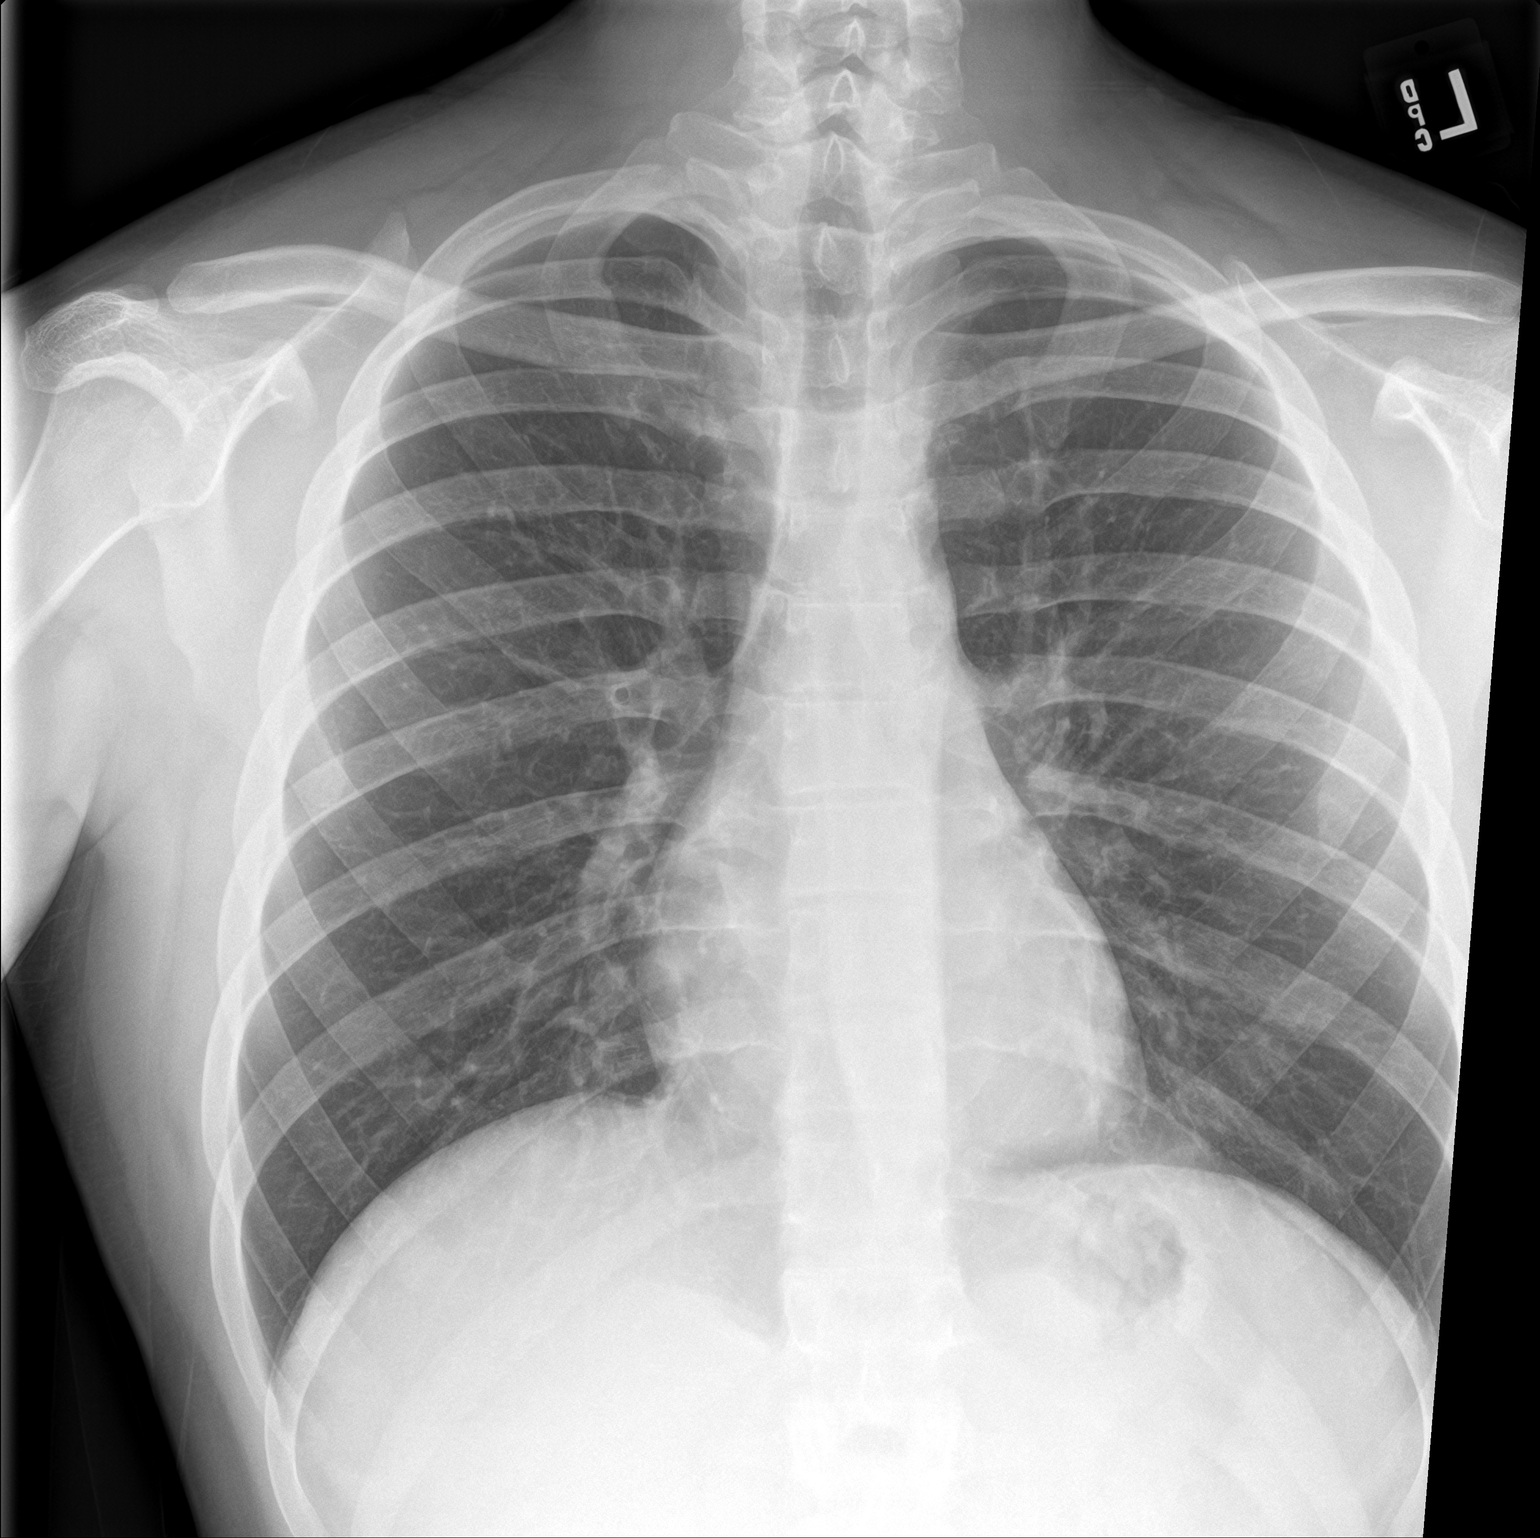

[chest lat]
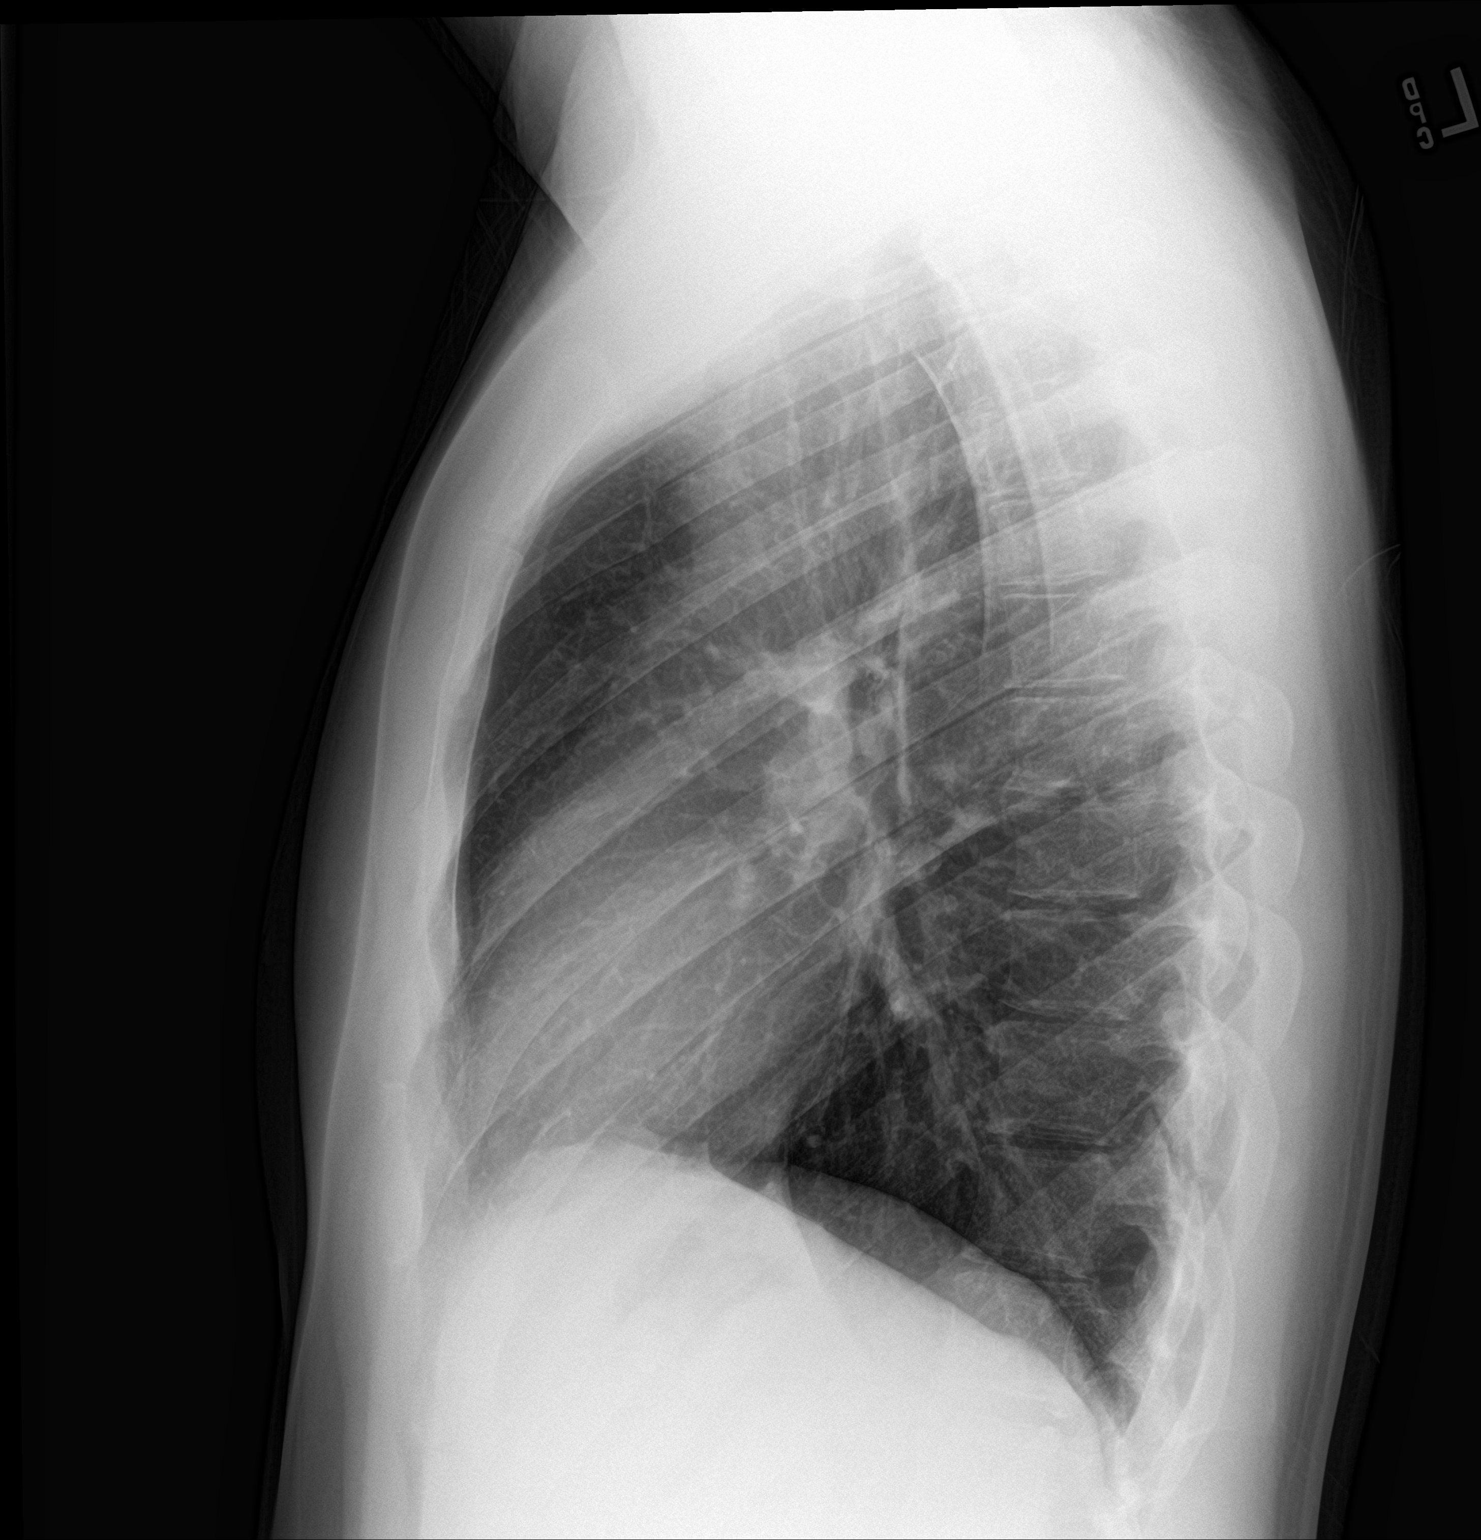

[2 of 2 positions shown; findings below may reference images not displayed]

FINDINGS: Normal lung volumes and mediastinal contours. Visualized tracheal
air column is within normal limits. The left lateral lung base is
excluded on the PA view, but otherwise both lungs appear clear. No
pneumothorax or pleural effusion. No osseous abnormality identified.
Negative visible bowel gas.
IMPRESSION: Negative, no cardiopulmonary abnormality.
